# Patient Record
Sex: Female | Born: 1970 | Race: White | Hispanic: No | Marital: Married | Smoking: Never smoker
Health system: Southern US, Community
[De-identification: ages and names within clinical notes are randomized; demographics above are authoritative.]

## PROBLEM LIST (undated history)

## (undated) DIAGNOSIS — N6011 Diffuse cystic mastopathy of right breast: Secondary | ICD-10-CM

## (undated) DIAGNOSIS — E039 Hypothyroidism, unspecified: Secondary | ICD-10-CM

## (undated) DIAGNOSIS — E669 Obesity, unspecified: Secondary | ICD-10-CM

## (undated) DIAGNOSIS — J029 Acute pharyngitis, unspecified: Secondary | ICD-10-CM

## (undated) DIAGNOSIS — K602 Anal fissure, unspecified: Principal | ICD-10-CM

## (undated) DIAGNOSIS — J309 Allergic rhinitis, unspecified: Secondary | ICD-10-CM

## (undated) DIAGNOSIS — I10 Essential (primary) hypertension: Secondary | ICD-10-CM

## (undated) DIAGNOSIS — E282 Polycystic ovarian syndrome: Secondary | ICD-10-CM

## (undated) HISTORY — DX: Obesity, unspecified: E66.9

## (undated) HISTORY — PX: TUBAL LIGATION: SHX77

## (undated) HISTORY — PX: ABDOMINAL HYSTERECTOMY: SHX81

## (undated) HISTORY — DX: Allergic rhinitis, unspecified: J30.9

## (undated) HISTORY — DX: Anal fissure, unspecified: K60.2

## (undated) HISTORY — DX: Polycystic ovarian syndrome: E28.2

## (undated) HISTORY — DX: Acute pharyngitis, unspecified: J02.9

## (undated) HISTORY — DX: Hypothyroidism, unspecified: E03.9

## (undated) HISTORY — DX: Diffuse cystic mastopathy of right breast: N60.11

## (undated) HISTORY — DX: Essential (primary) hypertension: I10

---

## 2002-02-10 ENCOUNTER — Other Ambulatory Visit: Admission: RE | Admit: 2002-02-10 | Discharge: 2002-02-10 | Payer: Self-pay | Admitting: Obstetrics and Gynecology

## 2002-03-26 ENCOUNTER — Encounter: Admission: RE | Admit: 2002-03-26 | Discharge: 2002-06-24 | Payer: Self-pay | Admitting: *Deleted

## 2004-04-05 ENCOUNTER — Other Ambulatory Visit: Admission: RE | Admit: 2004-04-05 | Discharge: 2004-04-05 | Payer: Self-pay | Admitting: Obstetrics and Gynecology

## 2007-05-21 ENCOUNTER — Other Ambulatory Visit: Admission: RE | Admit: 2007-05-21 | Discharge: 2007-05-21 | Payer: Self-pay | Admitting: Obstetrics and Gynecology

## 2007-09-16 ENCOUNTER — Encounter: Payer: Self-pay | Admitting: Family Medicine

## 2007-09-16 ENCOUNTER — Ambulatory Visit: Payer: Self-pay | Admitting: Family Medicine

## 2007-09-16 DIAGNOSIS — E282 Polycystic ovarian syndrome: Secondary | ICD-10-CM | POA: Insufficient documentation

## 2007-09-16 DIAGNOSIS — E039 Hypothyroidism, unspecified: Secondary | ICD-10-CM

## 2007-09-16 HISTORY — DX: Hypothyroidism, unspecified: E03.9

## 2007-09-16 HISTORY — DX: Polycystic ovarian syndrome: E28.2

## 2008-07-22 ENCOUNTER — Ambulatory Visit (HOSPITAL_COMMUNITY): Admission: RE | Admit: 2008-07-22 | Discharge: 2008-07-22 | Payer: Self-pay | Admitting: Obstetrics and Gynecology

## 2008-07-23 ENCOUNTER — Ambulatory Visit: Payer: Self-pay | Admitting: Family Medicine

## 2008-07-23 DIAGNOSIS — I10 Essential (primary) hypertension: Secondary | ICD-10-CM

## 2008-07-23 HISTORY — DX: Essential (primary) hypertension: I10

## 2008-08-03 ENCOUNTER — Ambulatory Visit: Payer: Self-pay | Admitting: Family Medicine

## 2008-08-03 LAB — CONVERTED CEMR LAB
BUN: 11 mg/dL (ref 6–23)
CO2: 24 meq/L (ref 19–32)
Glucose, Bld: 117 mg/dL — ABNORMAL HIGH (ref 70–99)
Potassium: 3.6 meq/L (ref 3.5–5.1)
Sodium: 135 meq/L (ref 135–145)
T3, Free: 3 pg/mL (ref 2.3–4.2)
TSH: 1.62 microintl units/mL (ref 0.35–5.50)

## 2008-09-14 ENCOUNTER — Ambulatory Visit: Payer: Self-pay | Admitting: Family Medicine

## 2008-09-14 DIAGNOSIS — J309 Allergic rhinitis, unspecified: Secondary | ICD-10-CM

## 2008-09-14 HISTORY — DX: Allergic rhinitis, unspecified: J30.9

## 2008-11-05 ENCOUNTER — Ambulatory Visit (HOSPITAL_COMMUNITY): Admission: RE | Admit: 2008-11-05 | Discharge: 2008-11-05 | Payer: Self-pay | Admitting: Obstetrics and Gynecology

## 2008-11-05 ENCOUNTER — Encounter (INDEPENDENT_AMBULATORY_CARE_PROVIDER_SITE_OTHER): Payer: Self-pay | Admitting: Obstetrics and Gynecology

## 2009-01-05 ENCOUNTER — Ambulatory Visit: Payer: Self-pay | Admitting: Family Medicine

## 2009-01-05 DIAGNOSIS — J029 Acute pharyngitis, unspecified: Secondary | ICD-10-CM

## 2009-01-05 HISTORY — DX: Acute pharyngitis, unspecified: J02.9

## 2009-03-25 ENCOUNTER — Ambulatory Visit: Payer: Self-pay | Admitting: Internal Medicine

## 2010-02-06 ENCOUNTER — Ambulatory Visit: Payer: Self-pay | Admitting: Family Medicine

## 2010-02-06 LAB — CONVERTED CEMR LAB
Bilirubin Urine: NEGATIVE
Glucose, Urine, Semiquant: NEGATIVE
Ketones, urine, test strip: NEGATIVE
Specific Gravity, Urine: 1.015
pH: 5.5

## 2010-02-07 LAB — CONVERTED CEMR LAB
Albumin: 3.8 g/dL (ref 3.5–5.2)
BUN: 10 mg/dL (ref 6–23)
Basophils Absolute: 0 10*3/uL (ref 0.0–0.1)
Basophils Relative: 0.5 % (ref 0.0–3.0)
Bilirubin, Direct: 0.1 mg/dL (ref 0.0–0.3)
CO2: 28 meq/L (ref 19–32)
Chloride: 105 meq/L (ref 96–112)
Cholesterol: 221 mg/dL — ABNORMAL HIGH (ref 0–200)
Creatinine, Ser: 0.9 mg/dL (ref 0.4–1.2)
Eosinophils Absolute: 0 10*3/uL (ref 0.0–0.7)
Glucose, Bld: 92 mg/dL (ref 70–99)
MCHC: 34.2 g/dL (ref 30.0–36.0)
MCV: 88.2 fL (ref 78.0–100.0)
Monocytes Absolute: 0.4 10*3/uL (ref 0.1–1.0)
Neutrophils Relative %: 58.4 % (ref 43.0–77.0)
RBC: 4.55 M/uL (ref 3.87–5.11)
RDW: 13.7 % (ref 11.5–14.6)
Total Protein: 7.7 g/dL (ref 6.0–8.3)

## 2010-07-25 NOTE — Assessment & Plan Note (Signed)
Summary: med check/refill/cjr   Vital Signs:  Patient profile:   40 year old female Height:      70.5 inches Weight:      270 pounds BMI:     38.33 Temp:     98.1 degrees F oral BP sitting:   120 / 80  (left arm) Cuff size:   large  Vitals Entered By: Kern Reap CMA Duncan Dull) (February 06, 2010 10:09 AM) CC: follow-up visit   CC:  follow-up visit.  History of Present Illness: [name] is a 40 year old, married female, nonsmoker, who comes in today for evaluation of hypertension, allergic rhinitis, and occasional sleep dysfunction.  Her hypertension is treated with Zestoretic 20 -- 12.5 daily.  BP 120/80.  Her allergic rhinitis is treated with steroid nasal spray and Claritin q.a.m..  She's having a lot of trouble with her allergies to medicine.  Doesn't seem to be helping.  Advised to switch to Zyrtec nightly and to use the Flonase nightly.  She takes 10 mg of Ambien or 4 times a month when she has severe sleep dysfunction.  She continues to see her endocrinologist on a regular basis because she has hypothyroidism and polycystic ovarian syndrome.  The going to start her on metformin.  She also sees her GYN for annual evaluation.  They have her on BCPs because of a history of endometriosis.  Allergies: 1)  ! Codeine 2)  ! Penicillin  Past History:  Past medical, surgical, family and social histories (including risk factors) reviewed, and no changes noted (except as noted below).  Past Medical History: Reviewed history from 07/23/2008 and no changes required.  childbirth x 2 hospitalized for evaluation of bladder dysfunction, unknown.  Diagnosis.  Symptoms resolve spontaneously PCOS Hypothyroidism Hypertension  Family History: Reviewed history from 07/23/2008 and no changes required. Family History Hypertension  Social History: Reviewed history from 09/16/2007 and no changes required. Occupation: Married Never Smoked Alcohol use-no Drug use-no Regular  exercise-no  Review of Systems      See HPI  Physical Exam  General:  Well-developed,well-nourished,in no acute distress; alert,appropriate and cooperative throughout examination Head:  Normocephalic and atraumatic without obvious abnormalities. No apparent alopecia or balding. Eyes:  No corneal or conjunctival inflammation noted. EOMI. Perrla. Funduscopic exam benign, without hemorrhages, exudates or papilledema. Vision grossly normal. Ears:  External ear exam shows no significant lesions or deformities.  Otoscopic examination reveals clear canals, tympanic membranes are intact bilaterally without bulging, retraction, inflammation or discharge. Hearing is grossly normal bilaterally. Nose:  External nasal examination shows no deformity or inflammation. Nasal mucosa are pink and moist without lesions or exudates. Mouth:  Oral mucosa and oropharynx without lesions or exudates.  Teeth in good repair. Neck:  No deformities, masses, or tenderness noted. Chest Wall:  No deformities, masses, or tenderness noted. Lungs:  Normal respiratory effort, chest expands symmetrically. Lungs are clear to auscultation, no crackles or wheezes. Heart:  Normal rate and regular rhythm. S1 and S2 normal without gallop, murmur, click, rub or other extra sounds. Abdomen:  Bowel sounds positive,abdomen soft and non-tender without masses, organomegaly or hernias noted. Msk:  No deformity or scoliosis noted of thoracic or lumbar spine.   Pulses:  R and L carotid,radial,femoral,dorsalis pedis and posterior tibial pulses are full and equal bilaterally Extremities:  No clubbing, cyanosis, edema, or deformity noted with normal full range of motion of all joints.   Neurologic:  No cranial nerve deficits noted. Station and gait are normal. Plantar reflexes are down-going bilaterally. DTRs are  symmetrical throughout. Sensory, motor and coordinative functions appear intact. Skin:  Intact without suspicious lesions or  rashes Cervical Nodes:  No lymphadenopathy noted Inguinal Nodes:  No significant adenopathy Psych:  Cognition and judgment appear intact. Alert and cooperative with normal attention span and concentration. No apparent delusions, illusions, hallucinations   Impression & Recommendations:  Problem # 1:  HYPERTENSION (ICD-401.9) Assessment Improved  Her updated medication list for this problem includes:    Zestoretic 20-12.5 Mg Tabs (Lisinopril-hydrochlorothiazide) .Marland Kitchen... Take 1 tablet by mouth every morning  Orders: Venipuncture (16109) TLB-Lipid Panel (80061-LIPID) TLB-BMP (Basic Metabolic Panel-BMET) (80048-METABOL) TLB-CBC Platelet - w/Differential (85025-CBCD) TLB-Hepatic/Liver Function Pnl (80076-HEPATIC) Prescription Created Electronically [id number]) UA Dipstick w/o Micro (automated)  (81003)  Problem # 2:  HYPOTHYROIDISM (ICD-244.9) Assessment: Improved  Orders: Venipuncture (09811) TLB-Lipid Panel (80061-LIPID) TLB-BMP (Basic Metabolic Panel-BMET) (80048-METABOL) TLB-CBC Platelet - w/Differential (85025-CBCD) TLB-Hepatic/Liver Function Pnl (80076-HEPATIC) Prescription Created Electronically (B1478) UA Dipstick w/o Micro (automated)  (81003)  Problem # 3:  Preventive Health Care (ICD-V70.0) Assessment: Unchanged  Complete Medication List: 1)  Unithyroid  .... Take one tablet daily 2)  Zestoretic 20-12.5 Mg Tabs (Lisinopril-hydrochlorothiazide) .... Take 1 tablet by mouth every morning 3)  Flonase 50 Mcg/act Susp (Fluticasone propionate) .... Uad 4)  Ambien 10 Mg Tabs (Zolpidem tartrate) .... Take one tab by mouth at bedtime 5)  Junel Fe 1/20 1-20 Mg-mcg Tabs (Norethin ace-eth estrad-fe) .... One tab by mouth once daily  Other Orders: Tdap => 48yrs IM (29562) Admin 1st Vaccine (13086) Specimen Handling (57846)  Patient Instructions: 1)  continue current medications except stop the Claritin and use plain Zyrtec, along with the steroid nasal spray nightly for  y   allergic rhinitis. 2)  Please schedule a follow-up appointment in 1 year. 3)  Take an Aspirin every day. Prescriptions: AMBIEN 10 MG TABS (ZOLPIDEM TARTRATE) take one tab by mouth at bedtime  #30 x 1   Entered and Authorized by:   Roderick Pee MD   Signed by:   Roderick Pee MD on 02/06/2010   Method used:   Print then Give to Patient   RxID:   [id number] FLONASE 50 MCG/ACT SUSP (FLUTICASONE PROPIONATE) UAD  #3 units x 3   Entered and Authorized by:   Roderick Pee MD   Signed by:   Roderick Pee MD on 02/06/2010   Method used:   Electronically to        [facility] (retail)       [address]       [place], [place]  [address]       Ph: [phone] or [phone]       Fax: [phone]   RxID:   [id number] ZESTORETIC 20-12.5 MG TABS (LISINOPRIL-HYDROCHLOROTHIAZIDE) Take 1 tablet by mouth every morning  #100 x 3   Entered and Authorized by:   Roderick Pee MD   Signed by:   Roderick Pee MD on 02/06/2010   Method used:   Electronically to        [facility] (retail)       [address]       [place], [place]  [address]       Ph: [phone] or [phone]       Fax: [phone]   RxID:   [id number]    Immunizations Administered:  Tetanus Vaccine:    Vaccine Type: Tdap    Site: left deltoid    Mfr: GlaxoSmithKline    Dose: 0.5 ml    Route: IM    Given by: Kern Reap CMA (AAMA)    Exp. Date: 12/23/2011    Lot #: [id number]    VIS given: 05/13/07 version given February 06, 2010.    Physician counseled: yes   Laboratory Results   Urine Tests  Date/Time Recieved: February 06, 2010 3:08 PM  Date/Time Reported: February 06, 2010 3:08 PM   Routine Urinalysis   Color: yellow Appearance: Clear Glucose: negative   (Normal Range: Negative) Bilirubin: negative   (Normal Range: Negative) Ketone: negative   (Normal Range: Negative) Spec. Gravity: 1.015   (Normal Range:  1.003-1.035) Blood: trace-lysed   (Normal Range: Negative) pH: 5.5   (Normal Range: 5.0-8.0) Protein: negative   (Normal Range: Negative) Urobilinogen: 0.2   (Normal Range: 0-1) Nitrite: negative   (Normal Range: Negative) Leukocyte Esterace: negative   (Normal Range: Negative)    Comments: Wynona Canes, CMA  February 06, 2010 3:08 PM

## 2010-10-03 LAB — BASIC METABOLIC PANEL
CO2: 26 mEq/L (ref 19–32)
Calcium: 9.4 mg/dL (ref 8.4–10.5)
GFR calc Af Amer: >60 mL/min (ref >60)
GFR calc non Af Amer: >60 mL/min (ref >60)
Sodium: 136 mEq/L (ref 135–145)

## 2010-10-03 LAB — CBC
Hemoglobin: 12.5 g/dL (ref 12.0–15.0)
RBC: 4.47 MIL/uL (ref 3.87–5.11)

## 2010-10-09 LAB — CBC
MCHC: 32.6 g/dL (ref 30.0–36.0)
MCV: 80.5 fL (ref 78.0–100.0)
Platelets: 328 10*3/uL (ref 150–400)
RDW: 15.3 % (ref 11.5–15.5)

## 2010-10-09 LAB — COMPREHENSIVE METABOLIC PANEL
AST: 33 U/L (ref 0–37)
Albumin: 4.2 g/dL (ref 3.5–5.2)
CO2: 26 mEq/L (ref 19–32)
Calcium: 9.7 mg/dL (ref 8.4–10.5)
Creatinine, Ser: 0.76 mg/dL (ref 0.4–1.2)
GFR calc Af Amer: >60 mL/min (ref >60)
GFR calc non Af Amer: >60 mL/min (ref >60)
Sodium: 140 mEq/L (ref 135–145)
Total Protein: 9.1 g/dL — ABNORMAL HIGH (ref 6.0–8.3)

## 2010-10-27 ENCOUNTER — Other Ambulatory Visit: Payer: Self-pay | Admitting: Obstetrics and Gynecology

## 2010-10-27 ENCOUNTER — Encounter (HOSPITAL_COMMUNITY): Payer: BC Managed Care – PPO

## 2010-10-27 LAB — BASIC METABOLIC PANEL
CO2: 25 mEq/L (ref 19–32)
Chloride: 101 mEq/L (ref 96–112)
Creatinine, Ser: 0.93 mg/dL (ref 0.4–1.2)
GFR calc Af Amer: >60 mL/min (ref >60)
Glucose, Bld: 77 mg/dL (ref 70–99)

## 2010-10-27 LAB — CBC
HCT: 43.4 % (ref 36.0–46.0)
Hemoglobin: 14.5 g/dL (ref 12.0–15.0)
MCH: 29.1 pg (ref 26.0–34.0)
RBC: 4.99 MIL/uL (ref 3.87–5.11)

## 2010-10-27 LAB — SURGICAL PCR SCREEN: MRSA, PCR: NEGATIVE

## 2010-11-06 ENCOUNTER — Ambulatory Visit (HOSPITAL_COMMUNITY)
Admission: RE | Admit: 2010-11-06 | Discharge: 2010-11-07 | Disposition: A | Discharge status: Home / Self Care | Payer: BC Managed Care – PPO | Source: Ambulatory Visit | Attending: Obstetrics and Gynecology | Admitting: Obstetrics and Gynecology

## 2010-11-06 ENCOUNTER — Other Ambulatory Visit: Payer: Self-pay | Admitting: Obstetrics and Gynecology

## 2010-11-06 DIAGNOSIS — N946 Dysmenorrhea, unspecified: Secondary | ICD-10-CM | POA: Insufficient documentation

## 2010-11-06 DIAGNOSIS — N8300 Follicular cyst of ovary, unspecified side: Secondary | ICD-10-CM | POA: Insufficient documentation

## 2010-11-06 DIAGNOSIS — Z01818 Encounter for other preprocedural examination: Secondary | ICD-10-CM | POA: Insufficient documentation

## 2010-11-06 DIAGNOSIS — Z01812 Encounter for preprocedural laboratory examination: Secondary | ICD-10-CM | POA: Insufficient documentation

## 2010-11-07 LAB — CBC
HCT: 37.2 % (ref 36.0–46.0)
Hemoglobin: 12.2 g/dL (ref 12.0–15.0)
WBC: 8.5 10*3/uL (ref 4.0–10.5)

## 2010-11-07 NOTE — Op Note (Signed)
NAMEKORYNNE, [name]               ACCOUNT NO.:  [id number]   MEDICAL RECORD NO.:  [id number]          PATIENT TYPE:  AMB   LOCATION:  SDC                           [facility]ACILITY:  [facility]   PHYSICIAN:  Guy Sandifer. Henderson Cloud, M.D. DATE OF BIRTH:  02-04-1971   DATE OF PROCEDURE:  11/05/2008  DATE OF DISCHARGE:                               OPERATIVE REPORT   PREOPERATIVE DIAGNOSES:  1. Menorrhagia.  2. Desires permanent sterilization.   POSTOPERATIVE DIAGNOSES:  1. Menorrhagia.  2. Desires permanent sterilization.   PROCEDURE:  NovaSure endometrial ablation, hysteroscopy, dilatation and  curettage, laparoscopy with bilateral tubal ligation with Filshie clips.   SURGEON:  Guy Sandifer. Henderson Cloud, MD   ANESTHESIA:  General with endotracheal intubation.   SPECIMENS:  Endometrial curettings to Pathology.   ESTIMATED BLOOD LOSS:  Minimal.  I and Os of distending media 60 mL  deficit.   INDICATIONS AND CONSENT:  This patient is a 40 year old married white  female G3, P2 with irregular crampy heavy menses.  After discussion of  options, she is being admitted for laparoscopy with tubal ligation,  hysteroscopy, D&C, and NovaSure endometrial ablation.  Potential risks  and complications have been discussed preoperatively including but  limited to infection, uterine perforation, organ damage, bleeding  requiring transfusion of blood products with HIV and hepatitis  acquisition, DVT, PE, pneumonia, laparotomy, and hysterectomy.  Success  and failure rates of tubal ligation and endometrial ablation have been  reviewed.  Possible postoperative dyspareunia reviewed.  All questions  were answered and consent was signed on the chart.   FINDINGS:  Upper abdomen is grossly normal.  Uterus is upper normal in  size, smooth in contour.  Anterior posterior cul-de-sacs were normal.  Tubes and ovaries normal bilaterally.  Endometrial cavity, both  fallopian tube ostia identified.   PROCEDURE IN DETAIL:  The patient  was taken to the operating room where  she was identified, placed in dorsal supine position and general  anesthesia was induced via endotracheal intubation.  She was then placed  in dorsal lithotomy position where she was prepped abdominally and  vaginally, bladder straight catheterized, and draped in sterile fashion.  Time-out was undertaken.  A bivalve speculum was placed in vagina.  Anterior cervical lip was injected with 1.5% plain Marcaine and grasped  with a single-tooth tenaculum.  Paracervical block was placed at 2, 4,  5, 7, 8, and 10 o'clock positions with approximately 10 mL total of the  same solution.  Uterus sounds to 10 cm.  Cervix sounds to 5 cm.  Cervix  was gently progressively dilated.  Diagnostic hysteroscope was placed in  the endocervical canal and advanced under direct visualization using  distending media.  The above findings were noted.  Hysteroscope was  withdrawn and sharp curettage was carried out.  Hysteroscope was then  replaced and inspection reveals the cavity to be clean.  The NovaSure  endometrial device was then placed.  Cavity width was 3.8 cm.  Cavity  test was passed on the first attempt.  Ablation was then carried out  without difficulty for approximately 1 minute 20 seconds.  The  endometrial ablation device was removed and inspection revealed to be  intact.  The hysteroscope was then replaced in the endocervical canal,  again advanced under direct visualization with distending media.  Good  cautery effect was noted.  No evidence of perforation was noted.  The  single-tooth tenaculum was then replaced with a Hulka tenaculum and  attention was turned to the abdomen.  The infraumbilical and suprapubic  areas were both injected with approximately 5 mL total of the same  solution.  Small infraumbilical incision was made.  Then using a long  disposable Veress needle and towel clips on either side of the umbilicus  to elevate the anterior abdominal wall,  it was placed.  A good syringe  and drop test were noted.  A 2.5 liters of gas were then instilled under  low pressure with good tympany in the right upper quadrant.  Veress  needle was then removed and 10/11 Xcel bladeless disposable trocar  sleeve was placed using direct visualization with the diagnostic  laparoscope.  After placing, the operative laparoscope was used.  A  small suprapubic incision was made and a 5-mm Xcel bladeless disposable  trocar sleeve was placed under direct visualization without difficulty.  The above findings were noted.  After identifying fallopian tubes from  cornu to fimbria.  A Filshie clip was placed on the proximal 1/3 of the  tubes bilaterally.  After removal of the clip applicator, inspection  reveals the full width of the tube to be within the clip and the heel of  the clip to be visible through the mesosalpinx bilaterally.  Good  hemostasis was noted.  A 10 mL of the same Marcaine solution was  instilled into peritoneal cavity for a total of approximately 25 mL for  the entire case.  Suprapubic trocar sleeve was removed.  Pneumoperitoneum was reduced.  The umbilical trocar sleeve was removed.  Skin was closed with a 2-0 subcuticular Vicryl sutures and Dermabond was  placed on both incisions as well.  Hulka tenaculum was removed and good  hemostasis was noted.  All counts were correct.  The patient was  awakened, taken to recovery room in stable condition.       Guy Sandifer Henderson Cloud, M.D.  Electronically Signed     JET/MEDQ  D:  11/05/2008  T:  11/05/2008  Job:  [id number]

## 2010-11-07 NOTE — H&P (Signed)
[name], [name]               ACCOUNT NO.:  [id number]   MEDICAL RECORD NO.:  [id number]          PATIENT TYPE:  AMB   LOCATION:  SDC                           [facility]ILITY:  [facility]   PHYSICIAN:  Guy Sandifer. Henderson Cloud, M.D. DATE OF BIRTH:  03-23-1971   DATE OF ADMISSION:  DATE OF DISCHARGE:                              HISTORY & PHYSICAL   CHIEF COMPLAINT:  Heavy irregular menses and desires permanent  sterilization.   HISTORY OF PRESENT ILLNESS:  This patient is a 40 year old married white  female G3, P2, who has a history of irregular crampy menses.  She has  recently had blood pressure elevations on the birth control pill.  After  discussion of the options, she is being admitted for laparoscopy with  Filshie clip application hysteroscopy, dilatation and curettage, and  NovaSure endometrial ablation.  Ultrasound in my office on April 13, 2008, revealed the uterus measuring 11.3 x 6.0 x 7.6 cm.  The right  ovary has a 15-mm collapse cyst.  Left ovary appears normal with a 5-mm  follicular cyst.  All questions have been answered preoperatively and  potential risks and complications have been discussed.   PAST MEDICAL HISTORY:  Hypothyroidism.   PAST SURGICAL HISTORY:  Negative.   OBSTETRICAL HISTORY:  Cesarean section x1, vaginal delivery x1.   FAMILY HISTORY:  Positive for diabetes.   SOCIAL HISTORY:  Denies tobacco, alcohol, or drug abuse.   MEDICATIONS:  Femcon and Unithroid 100 mcg supplement daily.   ALLERGIES:  CODEINE leading to nausea and vomiting (the patient states  Percocet is okay and penicillin leading to a questionable rash).   REVIEW OF SYSTEMS:  NEUROLOGIC:  Denies headache.  CARDIAC:  Denies  chest pain.  PULMONARY:  Denies shortness of breath.   PHYSICAL EXAMINATION:  VITAL SIGNS:  Height 5 feet 20-1/2 inch, weight  266.8 pounds, blood pressure 152/100.  HEENT:  Without thyromegaly.  LUNGS:  Clear to auscultation.  HEART:  Regular rate and rhythm.  ABDOMEN:  Obese, soft, nontender without palpable masses.  PELVIC:  Vagina and cervix without lesion.  Uterus normal size, mobile,  nontender.  Adnexal exam nontender without masses.  EXTREMITIES:  Grossly within normal limits.  NEUROLOGIC:  Grossly within normal limits.   ASSESSMENT:  Irregular heavy menses with dysmenorrhea.   PLAN:  Laparoscopy with Filshie clip application hysteroscopy,  dilatation and curettage, NovaSure endometrial ablation.      Guy Sandifer Henderson Cloud, M.D.  Electronically Signed     JET/MEDQ  D:  07/13/2008  T:  07/14/2008  Job:  [id number]

## 2010-11-07 NOTE — H&P (Signed)
  [name], [name]               ACCOUNT NO.:  [id number]  MEDICAL RECORD NO.:  [id number]         PATIENT TYPE:  WAMB  LOCATION:                                [facility]ILITY:  [facility]  PHYSICIAN:  Guy Sandifer. Henderson Cloud, M.D. DATE OF BIRTH:  July 06, 1970  DATE OF ADMISSION:  11/06/2010 DATE OF DISCHARGE:                             HISTORY & PHYSICAL   CHIEF COMPLAINT:  Painful menses.  HISTORY OF PRESENT ILLNESS:  This patient is a 40 year old married white female, G3, P2, status post laparoscopy with tubal ligation and NovaSure endometrial ablation in May 2010.  She has had recurrent dysmenorrhea especially on the right side.  Attempts to control with the birth control pill have been unsatisfactory.  Ultrasound on Nov 01, 2010 revealed uterus measuring 11.0 x 6.7 x 8.2 cm with at least two intramural leiomyomata measuring 2.1 and 3.0 cm respectively.  The right ovary appears normal.  Unable to visualize the left ovary due to overlying bowel.  After discussion of options, she is being admitted for laparoscopically-assisted vaginal hysterectomy and removal of the tube and ovary if abnormal.  Potential risks and complications have been reviewed preoperatively.  PAST MEDICAL HISTORY: 1. Hypothyroidism. 2. Chronic hypertension.  PAST SURGICAL HISTORY:  Laparoscopy with tubal ligation and endometrial ablation as above.  OBSTETRIC HISTORY:  Cesarean section in 1999, vaginal birth in 2004.  MEDICATIONS: 1. Unithroid 0.1 mg daily. 2. Junel birth control pill daily. 3. Lisinopril daily.  ALLERGIES:  CODEINE leading to nausea and vomiting.  PENICILLIN leading to hives.  SOCIAL HISTORY:  The patient drinks on occasion.  Denies tobacco and drug abuse.  FAMILY HISTORY:  Positive for migraine headaches and diabetes.  REVIEW OF SYSTEMS:  NEUROLOGIC:  Denies headache.  CARDIAC:  Denies chest pain.  PULMONARY:  Denies shortness of breath.  PHYSICAL EXAMINATION:  VITAL SIGNS:  Height 5 feet and  10.5 inches, weight 261.6 pounds, blood pressure 128/78. LUNGS:  Clear to auscultation. HEART:  Regular rate and rhythm. ABDOMEN:  Obese, soft, nontender without masses. PELVIC:  Vulva, vagina and cervix without lesion.  Exam is compromised by patient habitus, although it is mildly tender throughout.  ASSESSMENT:  Dysmenorrhea.  PLAN:  Laparoscopically-assisted vaginal hysterectomy, removal of tube and ovary if abnormal.     Guy Sandifer. Henderson Cloud, M.D.     JET/MEDQ  D:  11/01/2010  T:  11/02/2010  Job:  [id number]  Electronically Signed by Harold Hedge M.D. on 11/07/2010 01:51:22 PM

## 2010-11-07 NOTE — Discharge Summary (Signed)
  [name], [name]               ACCOUNT NO.:  [id number]  MEDICAL RECORD NO.:  [id number]           PATIENT TYPE:  O  LOCATION:  9320                          [facility]ILITY:  [facility]  PHYSICIAN:  Guy Sandifer. Henderson Cloud, M.D. DATE OF BIRTH:  January 24, 1971  DATE OF ADMISSION:  11/06/2010 DATE OF DISCHARGE:  11/07/2010                              DISCHARGE SUMMARY   ADMITTING DIAGNOSIS:  Dysmenorrhea.  DISCHARGE DIAGNOSIS:  Dysmenorrhea and pelvic adhesions.  PROCEDURE:  On Nov 06, 2010, is laparoscopically-assisted vaginal hysterectomy and lysis of adhesions.  REASON FOR ADMISSION:  This patient is a 40 year old married white female G2, P2 status post tubal ligation and endometrial ablation with increasingly painful menses.  This is dictated in history and physical. She is admitted for surgical management.  HOSPITAL COURSE:  The patient was admitted to the hospital, undergoes the above procedure.  Estimated blood loss is 200 mL.  On the evening of surgery, she has good pain relief and is tolerating some food.  Vital signs are stable.  She is afebrile with dilute urine output.  On the day of discharge, she is tolerating regular diet, has excellent pain relief, is ambulating, passing flatus.  Vital signs are stable.  She is afebrile and hemoglobin is 12.2.  Pathology is pending.  She will be discharged after tolerating breakfast and after voiding.  CONDITION ON DISCHARGE:  Good.  DIET:  Regular as tolerated.  ACTIVITY:  No lifting, no operation of automobiles, no vaginal entry.  Followup is in the office in 2 weeks.  She is to call the office for problems including but not limited to temperature 101 degrees, persistent nausea, vomiting, increasing pain or heavy vaginal bleeding.  MEDICATIONS: 1. Percocet 5/325 mg, #30 one to two p.o. q.6 h p.r.n. 2. Ibuprofen 600 mg q.6 h p.r.n. and she will resume her Unithroid and     lisinopril.     Guy Sandifer Henderson Cloud, M.D.    JET/MEDQ  D:   11/07/2010  T:  11/07/2010  Job:  [id number]  Electronically Signed by Harold Hedge M.D. on 11/07/2010 01:51:31 PM

## 2010-11-07 NOTE — H&P (Signed)
[name], [name]               ACCOUNT NO.:  [id number]   MEDICAL RECORD NO.:  [id number]          PATIENT TYPE:  AMB   LOCATION:  SDC                           [facility]   PHYSICIAN:  Guy Sandifer. Henderson Cloud, M.D. DATE OF BIRTH:  11/17/1970   DATE OF ADMISSION:  DATE OF DISCHARGE:                              HISTORY & PHYSICAL   CHIEF COMPLAINT:  Heavy irregular menses and desires permanent  sterilization.   HISTORY OF PRESENT ILLNESS:  This patient is a 40 year old married white  female G3, P2, who has irregular crampy heavy menses.  After discussion  of options, she is being admitted for laparoscopy with tubal ligation,  hysteroscopy D and C, and Novasure endometrial ablation.  Potential  risks, complications, success, and failure rates have been reviewed.  Ultrasound in my office on Nov 02, 2008, revealed a uterus measuring  10.6 x 6.2 x 7.1 cm.  There is a 1.3-cm simple cyst on the right ovary.  The patient was scheduled for this surgery previously.  However, she was  hypertensive on presentation in the hospital and has been rescheduled  until today.   PAST MEDICAL HISTORY:  Thyroid disease and hypertension.   PAST SURGICAL HISTORY:  Negative.   OBSTETRICAL HISTORY:  Cesarean section in 1999 and vaginal birth in  2004.   MEDICATIONS:  1. Unithroid 100 mcg daily.  2. Lisinopril daily.  3. Zyrtec daily.   ALLERGIES:  CODEINE leading to nausea and vomiting and PENICILLIN  leading to hives.   SOCIAL HISTORY:  The patient has 4 drinks a month.  Denies tobacco and  drug abuse.   FAMILY HISTORY:  Positive for migraine headache and diabetes.   REVIEW OF SYSTEMS:  NEURO:  Denies headache.  CARDIAC:  No chest pain.  PULMONARY:  Denies shortness of breath.   PHYSICAL EXAMINATION:  VITAL SIGNS:  Height 5 feet 10-1/2 inches, weight  266.8 pounds, and blood pressure 114/80.  LUNGS:  Clear to auscultation.  HEART:  Regular rate and rhythm.  ABDOMEN:  Soft, nontender  without masses.  PELVIC:  Vulva, vagina, and cervix without lesion.  Uterus, upper normal  size, mobile, and nontender.  Adnexa nontender without palpable masses.  EXTREMITIES:  Grossly within normal limits.  NEUROLOGIC:  Grossly within normal limits.   ASSESSMENT:  Heavy irregular menses.   PLAN:  Laparoscopy with tubal ligation, hysteroscopy D and C, and  Novasure endometrial ablation.      Guy Sandifer Henderson Cloud, M.D.  Electronically Signed     JET/MEDQ  D:  11/02/2008  T:  11/02/2008  Job:  [id number]

## 2010-11-07 NOTE — Op Note (Signed)
[name], [name]               ACCOUNT NO.:  [id number]  MEDICAL RECORD NO.:  [id number]           PATIENT TYPE:  LOCATION:                                 FACILITY:  PHYSICIAN:  Guy Sandifer. Henderson Cloud, M.D. DATE OF BIRTH:  [birth year]  DATE OF PROCEDURE:  11/06/2010 DATE OF DISCHARGE:                              OPERATIVE REPORT   PREOPERATIVE DIAGNOSIS:  Dysmenorrhea.  POSTOPERATIVE DIAGNOSES:  Dysmenorrhea and pelvic adhesions.  PROCEDURE:  Laparoscopy with lysis of adhesions.  SURGEON:  Guy Sandifer. Henderson Cloud, MD  ASSISTANT:  Dineen Kid. Rana Snare, MD  ANESTHESIA:  General endotracheal intubation.  SPECIMENS:  Uterus to pathology, weight 232 grams.  ESTIMATED BLOOD LOSS:  200 mL.  INDICATIONS AND CONSENT:  This patient is a 40 year old married white female G3, P2 status post tubal ligation and NovaSure endometrial ablation in May 2010.  She has had recurrent dysmenorrhea especially on the right side.  Details are dictated in the history and physical. Laparoscopically-assisted vaginal hysterectomy and removal of the tube and ovary if abnormal have been discussed.  Potential risks and complications have been reviewed preoperatively including but limited to infection, organ damage, bleeding requiring transfusion of blood products with HIV and hepatitis acquisition, DVT, PE, pneumonia, fistula formation, postoperative pain, and/or dyspareunia.  All questions have been answered and consent is signed on the chart.  FINDINGS:  Upper abdomen is grossly normal.  Uterus is about 10-12 weeks in size.  Left ovary has a smooth translucent 1 cm follicular cyst. Right ovary is normal.  Fallopian tubes are status post tubal ligation bilaterally with Filshie clips.  There are multiple filmy adhesions from the proximal portion of the fallopian tube and a Filshie clip to the right round ligament.  Anterior and posterior cul-de-sacs were clean.  PROCEDURE:  The patient was taken to operating room  where she was identified, placed in dorsal supine position and general anesthesia was induced via endotracheal intubation.  She was then placed in dorsal lithotomy position.  Time-out was undertaken.  She was prepped abdominally and vaginally.  Bladder straight catheterized.  Hulka tenaculum was placed.  The uterus is a manipulator and she was draped in sterile fashion.  The infraumbilical and suprapubic areas were injected midline with 0.5% plain Marcaine, approximately 6 mL total.  Small infraumbilical incision was made.  Disposable Veress was placed using towel clips on either side of the umbilicus to elevate the anterior abdominal wall.  This did not give a satisfactory syringe test. Therefore, the Veress needle was removed and open laparoscopy was pursued.  This infraumbilical midline incision was extended about 1 cm. Dissection was carried out in layers to the peritoneum which was bluntly entered.  Disposable Hasson trocar sleeve was then placed and anchored in place with 0 Vicryl sutures which are sewn to the anterior leaf of the fascia on the way in.  This allowed good visualization.  A small suprapubic incision was made and a 5-mm bladeless disposable trocar sleeve was placed under direct visualization.  The above findings were noted.  Using the enseal bipolar cautery cutting cautery cutting instrument, the adhesions on the right side  were taken down and the ligaments were taken down proximally down to the level of vesicouterine peritoneum.  This was also done on the left side.  The proximal portion of fallopian tube was incorporated in this bilaterally.  The vesicouterine peritoneum was taken down cephalad laterally.  Instruments were removed and attention was turned to the vagina.  Posterior cul-de- sac was entered sharply.  Cervix was circumscribed with unipolar cautery.  Mucosa was advanced sharply and bluntly.  Using the super jaws bipolar cautery cutting instrument,  progressive pedicles were taken down of the uterosacral ligaments, bladder pillars, cardinal ligaments, and uterine vessels bilaterally.  Anterior cul-de-sac was entered without difficulty.  Fundus was delivered posteriorly and the remaining pedicles were taken down.  All suture will be 0 Monocryl unless otherwise designated.  The uterosacral ligaments were plicated vaginal cuff bilaterally and I then plicated midline with a third suture.  Cuff was closed with figure-of-eights.  Foley catheter was placed in the bladder and clear urine was noted.  Attention was returned to the abdomen. Irrigation was carried out.  Only minor oozing at peritoneal edges was controlled with bipolar cautery.  Excess fluid was removed.  The remaining 29 mL of 0.5% plain Marcaine was instilled into peritoneal cavity.  Instruments were removed.  The umbilical incision was closed using the anchoring sutures of 0 Vicryl in the anterior fascia under good visualization to completely close the fascia.  Skin was closed with interrupted 2-0 Vicryl sutures on the umbilicus and suprapubically. Dermabond was applied.  All counts were correct.  The patient was awakened, taken to recovery room in stable condition.     Guy Sandifer Henderson Cloud, M.D.     JET/MEDQ  D:  11/06/2010  T:  11/06/2010  Job:  [id number]  Electronically Signed by Harold Hedge M.D. on 11/07/2010 01:51:26 PM

## 2010-11-13 ENCOUNTER — Emergency Department (HOSPITAL_BASED_OUTPATIENT_CLINIC_OR_DEPARTMENT_OTHER)
Admission: EM | Admit: 2010-11-13 | Discharge: 2010-11-13 | Disposition: A | Discharge status: Home / Self Care | Payer: BC Managed Care – PPO | Attending: Emergency Medicine | Admitting: Emergency Medicine

## 2010-11-13 DIAGNOSIS — E039 Hypothyroidism, unspecified: Secondary | ICD-10-CM | POA: Insufficient documentation

## 2010-11-13 DIAGNOSIS — I10 Essential (primary) hypertension: Secondary | ICD-10-CM | POA: Insufficient documentation

## 2010-11-13 DIAGNOSIS — R109 Unspecified abdominal pain: Secondary | ICD-10-CM | POA: Insufficient documentation

## 2010-11-13 DIAGNOSIS — N390 Urinary tract infection, site not specified: Secondary | ICD-10-CM | POA: Insufficient documentation

## 2010-11-13 LAB — URINALYSIS, ROUTINE W REFLEX MICROSCOPIC
Glucose, UA: NEGATIVE mg/dL
Specific Gravity, Urine: 1.011 (ref 1.005–1.030)

## 2010-11-13 LAB — URINE MICROSCOPIC-ADD ON

## 2011-01-30 ENCOUNTER — Telehealth: Payer: Self-pay | Admitting: Family Medicine

## 2011-01-30 NOTE — Telephone Encounter (Signed)
Pt called and is req info re: pts immunizations. Pls call.

## 2011-01-30 NOTE — Telephone Encounter (Signed)
Left message on machine for patient  Safe for patient to get a Td.

## 2011-02-08 ENCOUNTER — Ambulatory Visit: Payer: BC Managed Care – PPO | Admitting: Family Medicine

## 2011-02-09 ENCOUNTER — Encounter: Payer: Self-pay | Admitting: Family Medicine

## 2011-02-12 ENCOUNTER — Ambulatory Visit (INDEPENDENT_AMBULATORY_CARE_PROVIDER_SITE_OTHER): Payer: BC Managed Care – PPO | Admitting: Family Medicine

## 2011-02-12 ENCOUNTER — Other Ambulatory Visit (INDEPENDENT_AMBULATORY_CARE_PROVIDER_SITE_OTHER): Payer: BC Managed Care – PPO

## 2011-02-12 DIAGNOSIS — Z23 Encounter for immunization: Secondary | ICD-10-CM

## 2011-02-12 DIAGNOSIS — Z0000 Encounter for general adult medical examination without abnormal findings: Secondary | ICD-10-CM

## 2011-02-12 DIAGNOSIS — Z2089 Contact with and (suspected) exposure to other communicable diseases: Secondary | ICD-10-CM

## 2011-02-12 NOTE — Progress Notes (Signed)
Addended by: Kern Reap B on: 02/12/2011 11:42 AM   Modules accepted: Orders

## 2011-02-13 LAB — MEASLES/MUMPS/RUBELLA IMMUNITY: Rubella: 74 IU/mL — ABNORMAL HIGH

## 2011-02-20 ENCOUNTER — Other Ambulatory Visit: Payer: Self-pay | Admitting: *Deleted

## 2011-02-20 MED ORDER — LISINOPRIL-HYDROCHLOROTHIAZIDE 20-12.5 MG PO TABS: 1.0000 | ORAL_TABLET | Freq: Every day | ORAL | Status: DC

## 2011-02-27 ENCOUNTER — Telehealth: Payer: Self-pay | Admitting: Family Medicine

## 2011-02-27 NOTE — Telephone Encounter (Signed)
Pt req results from mmr titer. Pls call.

## 2011-02-28 NOTE — Telephone Encounter (Signed)
Spoke with patient and copy faxed

## 2011-03-12 ENCOUNTER — Ambulatory Visit: Payer: BC Managed Care – PPO | Admitting: Family Medicine

## 2011-03-19 ENCOUNTER — Ambulatory Visit: Payer: BC Managed Care – PPO | Admitting: Family Medicine

## 2011-03-23 ENCOUNTER — Ambulatory Visit (INDEPENDENT_AMBULATORY_CARE_PROVIDER_SITE_OTHER): Payer: BC Managed Care – PPO | Admitting: *Deleted

## 2011-03-23 DIAGNOSIS — Z23 Encounter for immunization: Secondary | ICD-10-CM

## 2011-03-28 ENCOUNTER — Encounter: Payer: Self-pay | Admitting: Family Medicine

## 2011-03-28 ENCOUNTER — Ambulatory Visit (INDEPENDENT_AMBULATORY_CARE_PROVIDER_SITE_OTHER): Payer: BC Managed Care – PPO | Admitting: Family Medicine

## 2011-03-28 DIAGNOSIS — J309 Allergic rhinitis, unspecified: Secondary | ICD-10-CM

## 2011-03-28 DIAGNOSIS — E282 Polycystic ovarian syndrome: Secondary | ICD-10-CM

## 2011-03-28 DIAGNOSIS — I10 Essential (primary) hypertension: Secondary | ICD-10-CM

## 2011-03-28 MED ORDER — LISINOPRIL-HYDROCHLOROTHIAZIDE 20-12.5 MG PO TABS: 1.0000 | ORAL_TABLET | Freq: Every day | ORAL | Status: DC

## 2011-03-28 NOTE — Patient Instructions (Signed)
Continue your blood pressure medication, one tab daily.  Remembered where a sunscreens SPF 50.  Return in one year for general checkup sooner if any problems

## 2011-03-28 NOTE — Progress Notes (Signed)
  Subjective:    Patient ID: [name], female    DOB: 25-Jun-1971, 40 y.o.   MRN: [id number]  Hypertension  [name] Is a 40 year old female, nonsmoker, who comes in today for annual examination.  Because of a history of PCO S., hyper-tension, sun damage.  Her blood pressures, treated with Zestoretic 20 -- 2.5 daily.  BP 110/70.  She is to stay right nasal spray for allergic rhinitis.  She is on metformin 500 mg in the morning and 1000 mg prior to evening meal from her endocrinologist because of PCO S.  She had her uterus removed in May for nonmalignant reasons.  Ovaries were left intact.  She was born and raised in [place] has light skin and light eyes and has had lesions removed.  Unfortunate, none of which were cancer.  However, she is in a high risk category.    Review of Systems  Constitutional: Negative.   HENT: Negative.   Eyes: Negative.   Respiratory: Negative.   Cardiovascular: Negative.   Gastrointestinal: Negative.   Genitourinary: Negative.   Musculoskeletal: Negative.   Neurological: Negative.   Hematological: Negative.   Psychiatric/Behavioral: Negative.        Objective:   Physical Exam  Constitutional: She appears well-developed and well-nourished.  HENT:  Head: Normocephalic and atraumatic.  Right Ear: External ear normal.  Left Ear: External ear normal.  Nose: Nose normal.  Mouth/Throat: Oropharynx is clear and moist.  Eyes: EOM are normal. Pupils are equal, round, and reactive to light.  Neck: Normal range of motion. Neck supple. No thyromegaly present.  Cardiovascular: Normal rate, regular rhythm, normal heart sounds and intact distal pulses.  Exam reveals no gallop and no friction rub.   No murmur heard. Pulmonary/Chest: Effort normal and breath sounds normal.  Abdominal: Soft. Bowel sounds are normal. She exhibits no distension and no mass. There is no tenderness. There is no rebound.  Musculoskeletal: Normal range of motion.    Lymphadenopathy:    She has no cervical adenopathy.  Neurological: She is alert. She has normal reflexes. No cranial nerve deficit. She exhibits normal muscle tone. Coordination normal.  Skin: Skin is warm and dry.  Psychiatric: She has a normal mood and affect. Her behavior is normal. Judgment and thought content normal.          Assessment & Plan:  Healthy female.  Hypertension.  Continue Zestoretic one daily.  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  Allergic rhinitis.  Continue steroid nasal spray.  History of sun damage.  Check.  Skin mildly return p.r.n. Sunscreens SPF 50.  Status post hysterectomy for fibroids.  May 2012  Return in one year sooner for any problems

## 2011-04-23 ENCOUNTER — Ambulatory Visit (INDEPENDENT_AMBULATORY_CARE_PROVIDER_SITE_OTHER): Payer: BC Managed Care – PPO | Admitting: Family Medicine

## 2011-04-23 DIAGNOSIS — Z23 Encounter for immunization: Secondary | ICD-10-CM

## 2011-06-04 ENCOUNTER — Encounter: Payer: Self-pay | Admitting: Family Medicine

## 2011-06-04 ENCOUNTER — Ambulatory Visit (INDEPENDENT_AMBULATORY_CARE_PROVIDER_SITE_OTHER): Payer: BC Managed Care – PPO | Admitting: Family Medicine

## 2011-06-04 ENCOUNTER — Telehealth: Payer: Self-pay | Admitting: Family Medicine

## 2011-06-04 VITALS — BP 118/80 | Temp 98.8°F | Wt 273.0 lb

## 2011-06-04 DIAGNOSIS — J069 Acute upper respiratory infection, unspecified: Secondary | ICD-10-CM

## 2011-06-04 MED ORDER — HYDROCODONE-HOMATROPINE 5-1.5 MG/5ML PO SYRP: ORAL_SOLUTION | ORAL | Status: DC

## 2011-06-04 NOTE — Patient Instructions (Signed)
Drink lots of water.  Hydromet one half to 1 teaspoon up to 3 times daily p.r.n. Cough.  Return p.r.n.

## 2011-06-04 NOTE — Telephone Encounter (Signed)
Appt made

## 2011-06-04 NOTE — Telephone Encounter (Signed)
Okay to work in

## 2011-06-04 NOTE — Progress Notes (Signed)
  Subjective:    Patient ID: [name], female    DOB: 1971/05/20, 40 y.o.   MRN: [id number]  HPI  T. Is a 40 year old female, nonsmoker, who comes in today with a cold for two weeks.  She has had congestion, sore throat, postnasal drip, and cough.  No fever, no sputum production.  No wheezing    Review of Systems General pulmonary venous systems otherwise negative    Objective:   Physical Exam Well-developed well-nourished, female in no acute distress.  Examination of the head, eyes, ears, nose, and throat were negative.  Neck is supple.  Thyroid is not enlarged.  Lungs are clear to auscultation       Assessment & Plan:  Viral syndrome.  Plan treat symptomatically

## 2011-06-04 NOTE — Telephone Encounter (Signed)
Wants to see Dr Tawanna Cooler today for chest congestion. Please advise patient. Thanks.

## 2011-08-15 ENCOUNTER — Ambulatory Visit: Payer: BC Managed Care – PPO | Admitting: Family Medicine

## 2011-08-20 ENCOUNTER — Ambulatory Visit (INDEPENDENT_AMBULATORY_CARE_PROVIDER_SITE_OTHER): Payer: BC Managed Care – PPO | Admitting: Family Medicine

## 2011-08-20 DIAGNOSIS — Z23 Encounter for immunization: Secondary | ICD-10-CM

## 2012-05-11 ENCOUNTER — Other Ambulatory Visit: Payer: Self-pay | Admitting: Family Medicine

## 2012-08-19 ENCOUNTER — Other Ambulatory Visit: Payer: Self-pay | Admitting: Family Medicine

## 2012-11-25 ENCOUNTER — Ambulatory Visit (INDEPENDENT_AMBULATORY_CARE_PROVIDER_SITE_OTHER): Payer: BC Managed Care – PPO | Admitting: Family Medicine

## 2012-11-25 ENCOUNTER — Encounter: Payer: Self-pay | Admitting: Family Medicine

## 2012-11-25 VITALS — BP 120/80 | Temp 98.3°F | Wt 259.0 lb

## 2012-11-25 DIAGNOSIS — E039 Hypothyroidism, unspecified: Secondary | ICD-10-CM

## 2012-11-25 DIAGNOSIS — I10 Essential (primary) hypertension: Secondary | ICD-10-CM

## 2012-11-25 DIAGNOSIS — E282 Polycystic ovarian syndrome: Secondary | ICD-10-CM

## 2012-11-25 DIAGNOSIS — N6019 Diffuse cystic mastopathy of unspecified breast: Secondary | ICD-10-CM

## 2012-11-25 DIAGNOSIS — N6011 Diffuse cystic mastopathy of right breast: Secondary | ICD-10-CM

## 2012-11-25 MED ORDER — LISINOPRIL-HYDROCHLOROTHIAZIDE 20-12.5 MG PO TABS: ORAL_TABLET | ORAL | Status: DC

## 2012-11-25 NOTE — Progress Notes (Signed)
  Subjective:    Patient ID: [name], female    DOB: April 05, 1971, 42 y.o.   MRN: [id number]  HPI [name] is a 42 year old married female nonsmoker who comes in today for general physical examination because of a history of hypertension, polycystic ovarian syndrome, and a new problem of fibrocystic breast changes. She also has hypothyroidism  For the PCL SN hypothyroidism she sees an endocrinologist  For hypertension she takes Zestoretic 20-4.5 daily BP 120/80  She had a GYN exam this year and found a breast lump in her left breast. She had a diagnostic ultrasound and mammography both of which were normal.  Family history pertinent in her mom has the same fibrocystic changes.   Review of Systems    review of systems otherwise negative,,,, weight 259 Objective:   Physical Exam  Well-developed extremely pleasant female no acute distress BP 120/80 right arm sitting position cardiopulmonary exam normal breast exam shows a marble-sized soft movable cystic lesion in the midshaft from the nipple on the right at 3:00. She also has multiple small fibrocystic lesions throughout both breasts.  Total body skin exam normal      Assessment & Plan:  Hypertension at goal continue current therapy  Fibrocystic disease,,,,,,,, demonstrated comprehensive BSE monthly and annual mammography return if any question

## 2012-11-25 NOTE — Patient Instructions (Signed)
Continue your blood pressure medication daily  Remember to do a thorough girl BSE monthly  If you have any questions about a Breast cyst  getting bigger call and we will evaluate you immediately  Return in one year for general physical exam sooner if any problems  SPF 50+ screen  Exercise 30 minutes daily walking

## 2013-04-17 ENCOUNTER — Ambulatory Visit (INDEPENDENT_AMBULATORY_CARE_PROVIDER_SITE_OTHER): Payer: BC Managed Care – PPO | Admitting: *Deleted

## 2013-04-17 DIAGNOSIS — Z23 Encounter for immunization: Secondary | ICD-10-CM

## 2013-04-17 DIAGNOSIS — Z0000 Encounter for general adult medical examination without abnormal findings: Secondary | ICD-10-CM

## 2013-04-21 ENCOUNTER — Ambulatory Visit (INDEPENDENT_AMBULATORY_CARE_PROVIDER_SITE_OTHER): Payer: BC Managed Care – PPO | Admitting: *Deleted

## 2013-04-21 DIAGNOSIS — Z299 Encounter for prophylactic measures, unspecified: Secondary | ICD-10-CM

## 2013-07-17 ENCOUNTER — Ambulatory Visit: Payer: BC Managed Care – PPO | Admitting: *Deleted

## 2013-12-27 ENCOUNTER — Other Ambulatory Visit: Payer: Self-pay | Admitting: Family Medicine

## 2013-12-29 ENCOUNTER — Other Ambulatory Visit: Payer: Self-pay | Admitting: Obstetrics and Gynecology

## 2013-12-29 DIAGNOSIS — N949 Unspecified condition associated with female genital organs and menstrual cycle: Secondary | ICD-10-CM

## 2013-12-31 ENCOUNTER — Ambulatory Visit
Admission: RE | Admit: 2013-12-31 | Discharge: 2013-12-31 | Disposition: A | Discharge status: Home / Self Care | Payer: BC Managed Care – PPO | Source: Ambulatory Visit | Attending: Obstetrics and Gynecology | Admitting: Obstetrics and Gynecology

## 2013-12-31 ENCOUNTER — Other Ambulatory Visit: Payer: BC Managed Care – PPO

## 2013-12-31 DIAGNOSIS — N949 Unspecified condition associated with female genital organs and menstrual cycle: Secondary | ICD-10-CM

## 2013-12-31 MED ORDER — GADOBENATE DIMEGLUMINE 529 MG/ML IV SOLN
20.0000 mL | Freq: Once | INTRAVENOUS | Status: AC | PRN
  Administered 2013-12-31: 20 mL via INTRAVENOUS

## 2014-02-18 ENCOUNTER — Encounter (HOSPITAL_COMMUNITY): Admission: RE | Payer: Self-pay | Source: Ambulatory Visit

## 2014-02-18 ENCOUNTER — Ambulatory Visit (HOSPITAL_COMMUNITY)
Admission: RE | Admit: 2014-02-18 | Payer: BC Managed Care – PPO | Source: Ambulatory Visit | Admitting: Obstetrics and Gynecology

## 2014-02-18 SURGERY — LAPAROSCOPY, DIAGNOSTIC
Anesthesia: General

## 2014-03-15 ENCOUNTER — Other Ambulatory Visit: Payer: Self-pay | Admitting: Family Medicine

## 2014-03-16 ENCOUNTER — Other Ambulatory Visit: Payer: Self-pay | Admitting: Family Medicine

## 2014-03-16 ENCOUNTER — Telehealth: Payer: Self-pay | Admitting: Family Medicine

## 2014-03-16 MED ORDER — LISINOPRIL-HYDROCHLOROTHIAZIDE 20-12.5 MG PO TABS: ORAL_TABLET | ORAL | Status: DC

## 2014-03-16 NOTE — Telephone Encounter (Signed)
Pt has made appt for 10/6 but does not have enough lisinopril-hydrochlorothiazide (PRINZIDE,ZESTORETIC) 20-12.5 MG per tablet  med to get her through. Can you send 30 days to  [facility] [place]

## 2014-03-30 ENCOUNTER — Encounter: Payer: Self-pay | Admitting: Family Medicine

## 2014-03-30 ENCOUNTER — Ambulatory Visit (INDEPENDENT_AMBULATORY_CARE_PROVIDER_SITE_OTHER): Payer: BC Managed Care – PPO | Admitting: Family Medicine

## 2014-03-30 VITALS — BP 110/80 | Temp 98.1°F | Wt 259.0 lb

## 2014-03-30 DIAGNOSIS — I10 Essential (primary) hypertension: Secondary | ICD-10-CM

## 2014-03-30 DIAGNOSIS — Z23 Encounter for immunization: Secondary | ICD-10-CM

## 2014-03-30 LAB — CBC WITH DIFFERENTIAL/PLATELET
BASOS PCT: 0.3 % (ref 0.0–3.0)
Basophils Absolute: 0 10*3/uL (ref 0.0–0.1)
Eosinophils Absolute: 0.2 10*3/uL (ref 0.0–0.7)
Eosinophils Relative: 2 % (ref 0.0–5.0)
HCT: 41.1 % (ref 36.0–46.0)
HEMOGLOBIN: 13.9 g/dL (ref 12.0–15.0)
Lymphocytes Relative: 30.1 % (ref 12.0–46.0)
Lymphs Abs: 2.3 10*3/uL (ref 0.7–4.0)
MCHC: 33.9 g/dL (ref 30.0–36.0)
MCV: 88.6 fl (ref 78.0–100.0)
MONOS PCT: 6.1 % (ref 3.0–12.0)
Monocytes Absolute: 0.5 10*3/uL (ref 0.1–1.0)
NEUTROS ABS: 4.8 10*3/uL (ref 1.4–7.7)
Neutrophils Relative %: 61.5 % (ref 43.0–77.0)
Platelets: 273 10*3/uL (ref 150.0–400.0)
RBC: 4.64 Mil/uL (ref 3.87–5.11)
RDW: 13.4 % (ref 11.5–15.5)
WBC: 7.8 10*3/uL (ref 4.0–10.5)

## 2014-03-30 LAB — BASIC METABOLIC PANEL
BUN: 12 mg/dL (ref 6–23)
CO2: 27 meq/L (ref 19–32)
Calcium: 8.9 mg/dL (ref 8.4–10.5)
Chloride: 101 mEq/L (ref 96–112)
Creatinine, Ser: 0.9 mg/dL (ref 0.4–1.2)
GFR: 69.13 mL/min (ref >60.00)
Glucose, Bld: 129 mg/dL — ABNORMAL HIGH (ref 70–99)
POTASSIUM: 3.5 meq/L (ref 3.5–5.1)
SODIUM: 136 meq/L (ref 135–145)

## 2014-03-30 MED ORDER — LISINOPRIL-HYDROCHLOROTHIAZIDE 20-12.5 MG PO TABS: ORAL_TABLET | ORAL | Status: DC

## 2014-03-30 NOTE — Progress Notes (Signed)
   Subjective:    Patient ID: [name], female    DOB: 05/15/1971, 43 y.o.   MRN: [id number]  HPI [name] is a 43 year old female nonsmoker who comes in today for evaluation of hypertension  He is on Zestoretic 20/12.5 daily BP 110/80. She also takes metformin 500 twice a day from her GYN for PCO S. and endocrinology treat her for her hypo-thyroidism.  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

## 2014-03-30 NOTE — Progress Notes (Signed)
Pre visit review using our clinic review tool, if applicable. No additional management support is needed unless otherwise documented below in the visit note. 

## 2014-03-30 NOTE — Patient Instructions (Signed)
Continue your current blood pressure medication daily  Followup in 1 year sooner if any problems  We will set up a nutrition consult to help you address your weight

## 2014-03-31 ENCOUNTER — Telehealth: Payer: Self-pay | Admitting: Family Medicine

## 2014-03-31 NOTE — Telephone Encounter (Signed)
emmi emailed °

## 2014-04-07 ENCOUNTER — Encounter: Payer: Self-pay | Admitting: Internal Medicine

## 2014-05-31 ENCOUNTER — Encounter: Payer: Self-pay | Admitting: *Deleted

## 2014-05-31 ENCOUNTER — Encounter: Payer: BC Managed Care – PPO | Attending: Family Medicine | Admitting: *Deleted

## 2014-05-31 DIAGNOSIS — E282 Polycystic ovarian syndrome: Secondary | ICD-10-CM | POA: Diagnosis not present

## 2014-05-31 DIAGNOSIS — Z6836 Body mass index (BMI) 36.0-36.9, adult: Secondary | ICD-10-CM | POA: Diagnosis not present

## 2014-05-31 DIAGNOSIS — Z713 Dietary counseling and surveillance: Secondary | ICD-10-CM | POA: Diagnosis not present

## 2014-05-31 NOTE — Progress Notes (Signed)
Medical Nutrition Therapy:  Appt start time: 0900 end time:  1000.   Assessment:  Primary concerns today: [name] is here for nutrition counseling pertaining to PCOS.  She was diagnosed  Maybe 8-10 years.  She would like to decrease her metformin dosage through weight loss.   Works with Chelsea RummageAutumn Jones, PA-C with [facility] (every 6 months).  [name] is frustrated because she hasn't had her medication adjusted in years.  She has not received any formal education about PCOS.  She has done her own research and found out the symptoms: fertility challenges, central adiposity, hirsutism, some insulin resistance. 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  She also is married with 2 children and her schedule is crazy. She graduates in May, but she admits she wasn't making healthy choices before school anyway, but now it may be worse [name] does most of the grocery shopping, but family dictates what foods are served.  She shares cooking responsibility with husband, and when she's really busy they go out to eat: 3 times/week at most: pizza, [place], sometimes fast food.  She usually eats in front of the tv, sometimes in the car.  She thinks she is a medium-paced eater. describes sleep issues- struggles with insomnia  Preferred Learning Style:  Auditory  Visual  Hands on  Learning Readiness:   Ready  MEDICATIONS: see list.  Metformin 2000 mg/day   DIETARY INTAKE:  Usual eating pattern includes 2-3 meals and 1-2 snacks per day.  Everyday foods include proteins, starches, vegetables.  Avoided foods include none.    24-hr recall:  B ( AM): hit or miss.  When she's out of school she doesn't have food, just coffee.  Might have bowl cereal  Snk ( AM): granola bar during school  L ( PM): bring from home- sandwich or pasta, or whatever leftovers.   Snk ( PM): raw veggies sometimes D ( PM): meat, starch, vegetable; tacos Snk ( PM):  not ususally Beverages: water, coffee in morning, occasionally tea  Usual physical activity: none  Estimated energy needs: 1800 calories 200 g carbohydrates 135 g protein 50 g fat    Nutritional Diagnosis:  [place]-2.1 Inpaired nutrition utilization As related to carbohydrates.  As evidenced by PCOS.    Intervention:  Nutrition counseling provided.  Discussed physiology of carbohydrate metabolism and how it is affected by PCOS.  Discussed hormonal imbalances associated with PCOS and how those imbalances present themselves with hirsutism, body acne, menstrual irregularity, obesity, and poor glycemic control.  Dicussed possible increased risk for CVD and the importance of nutrition management for overall health.   Recommended the Mediterranean style eating plan: MUFAs, whole grains, fruits, vegetables, legumes, lean proteins, and low-fat dairy.  Recommended limiting refined carbohydrates and concentrated sweets.  Suggested regularly scheduled meals and snacks and to avoid meal skipping.  Recommended fiber and lean protein with all meals and to include non-starchy vegetables with most meals.    Recommended regular physical activity of 150 minutes/week.  Discussed reading food labels: focusing on fiber and limited sugars and saturated, trans fats.  Suggested talking with provider about inositol supplementation:  D-chiro-inositol reduces circulating insulin, decreases serum androgens, and ameliorates some of the metabolic abnormalities (increased blood pressure and hypertriglyceridemia) of syndrome X   Teaching Method Utilized:  Visual Auditory   Handouts given during visit include: Mediterranean lifestyle Low Glycemic index foods What is inositol  Barriers to learning/adherence to lifestyle change: family and busy  schedule  Demonstrated degree of understanding via:  Teach Back   Monitoring/Evaluation:  Dietary intake, exercise, and body weight in 6-8 week(s).

## 2014-06-11 ENCOUNTER — Encounter: Payer: Self-pay | Admitting: Internal Medicine

## 2014-06-11 ENCOUNTER — Ambulatory Visit (INDEPENDENT_AMBULATORY_CARE_PROVIDER_SITE_OTHER): Payer: BC Managed Care – PPO | Admitting: Internal Medicine

## 2014-06-11 VITALS — BP 144/80 | HR 104 | Ht 70.5 in | Wt 260.4 lb

## 2014-06-11 DIAGNOSIS — K602 Anal fissure, unspecified: Secondary | ICD-10-CM

## 2014-06-11 DIAGNOSIS — K648 Other hemorrhoids: Secondary | ICD-10-CM | POA: Insufficient documentation

## 2014-06-11 HISTORY — DX: Anal fissure, unspecified: K60.2

## 2014-06-11 MED ORDER — BENEFIBER PO POWD: ORAL | Status: DC

## 2014-06-11 MED ORDER — DILTIAZEM GEL 2 %: 1.0000 "application " | Freq: Three times a day (TID) | CUTANEOUS | Status: DC

## 2014-06-11 NOTE — Progress Notes (Signed)
   Subjective:    Patient ID: Barbarann Ehlers, female    DOB: 11/08/1970, 43 y.o.   MRN: [id number]  HPI  Patient presents for evaluation of rectal bleeding and some rectal pain. Last year after a cruise started to have more constipation. Also had some heartburn and indigestion which was self-limited over a few months.Has been having sharp pain with defecation "like glass" and also bright red blood on the tissue only frequently. Can feel swelling at anal area and has an old anal tag. Dr. Gaetano Net recommended probiotic which has helped but still as the problems. Some straining but no prolonged stooling or toilet time. She's never had any type of endoscopic workup. While she has some hard ball-like stools at times and large stools she does not have any progressive decline in stool caliber or anything like that intends to move bowels on most days. In the past she said she is to have more loose stools. 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  Medications, allergies, past medical history, past surgical history, family history and social history are reviewed and updated in the EMR.  Review of Systems As above, working on weight loss with dietitian. Denies any recent infections lung or cardiac problems.    Objective:   Physical Exam General:  Obese, well-developed, well-nourished and in no acute distress Eyes:  anicteric. Lungs: Clear to auscultation bilaterally. Heart:  S1S2, no rubs, murmurs, gallops. Abdomen:  soft, non-tender, no hepatosplenomegaly, hernia, or mass and BS+.  Rectal:  Patti [place], [place] present.  Anoderm shows anterior scar from prior birth trauma tear There is a firm ridge posterior which is slightly render Brown stool, no mass   Anoscopy was performed with the patient in the left lateral decubitus position while a chaperone was present and revealed a posteror anal fissure with Grade 1 RP and LL internal  hemorrhoids  Neuro:  A&O x 3.  Psych:  appropriate mood and  Affect.   Data Reviewed: PCP note in the computer from October 2015 CBC was normal then though was be met except hyperglycemia nonfasting    Assessment & Plan:  Anal fissure-posterior I think this is her primary problem. She has a posterior fissure that I will treat with diltiazem gel twice a day to 3 times a day. She will return in 2 months for reassessment and depending upon what is going on May need further treatment, may need banding of hemorrhoids. She will also add Benefiber 2 tablespoons daily.  Internal hemorrhoids Anoscopy has shown right posterior and left lateral internal hemorrhoids, there is a small external component as well though that is minor, I'm not sure these are that symptomatic right now. We will treat the fissure and follow-up and determine if there is any need for banding.    I reviewed with her the importance to follow-up and make sure this resolves. A colonoscopy could be needed but it's really sounds like some constipation with anorectal problems.  I appreciate the opportunity to care for this patient.  I will send a copy to Radene Journey M.D.

## 2014-06-11 NOTE — Assessment & Plan Note (Signed)
Anoscopy has shown right posterior and left lateral internal hemorrhoids, there is a small external component as well though that is minor, I'm not sure these are that symptomatic right now. We will treat the fissure and follow-up and determine if there is any need for banding.

## 2014-06-11 NOTE — Assessment & Plan Note (Signed)
I think this is her primary problem. She has a posterior fissure that I will treat with diltiazem gel twice a day to 3 times a day. She will return in 2 months for reassessment and depending upon what is going on May need further treatment, may need banding of hemorrhoids. She will also add Benefiber 2 tablespoons daily.

## 2014-06-11 NOTE — Patient Instructions (Addendum)
Today you have been given a handout to read on Anal Fissure and Benefiber.   Follow up with us in February, appointment made for the 9th at 8:30am.   We have sent the following medications to your pharmacy for you to pick up at your convenience: Diltiazem gel and Benefiber, [facility]   I appreciate the opportunity to care for you.

## 2014-07-17 ENCOUNTER — Other Ambulatory Visit: Payer: Self-pay | Admitting: Family Medicine

## 2014-08-02 ENCOUNTER — Ambulatory Visit: Payer: BC Managed Care – PPO | Admitting: *Deleted

## 2014-08-03 ENCOUNTER — Ambulatory Visit (INDEPENDENT_AMBULATORY_CARE_PROVIDER_SITE_OTHER): Payer: BLUE CROSS/BLUE SHIELD | Admitting: Internal Medicine

## 2014-08-03 ENCOUNTER — Encounter: Payer: Self-pay | Admitting: Internal Medicine

## 2014-08-03 VITALS — BP 100/64 | HR 72 | Ht 70.0 in | Wt 261.2 lb

## 2014-08-03 DIAGNOSIS — K602 Anal fissure, unspecified: Secondary | ICD-10-CM

## 2014-08-03 DIAGNOSIS — K648 Other hemorrhoids: Secondary | ICD-10-CM

## 2014-08-03 MED ORDER — DILTIAZEM GEL 2 %: 1.0000 "application " | Freq: Three times a day (TID) | CUTANEOUS | Status: DC

## 2014-08-03 NOTE — Assessment & Plan Note (Signed)
Improving Continue diltiazem Increase Benefiber to 2 tbsp/day RTC 2-3 months

## 2014-08-03 NOTE — Progress Notes (Signed)
   Subjective:    Patient ID: [name], female    DOB: 07/23/1970, 44 y.o.   MRN: [id number] Chief complaint: rectal bleeding and rectal pain with anal fissure HPI Patient is here for follow-up of anal fissure and hemorrhoids. She is on diltiazem gel and Benefiber. She reports progressive improvement in her symptoms with less pain and less bleeding. Her stools are not always soft and easy to pass. She misunderstood and is using a teaspoon of Benefiber daily. Medications, allergies, past medical history, past surgical history, family history and social history are reviewed and updated in the EMR.  Review of Systems As per history of present illness    Objective:   Physical Exam BP 100/64 mmHg  Pulse 72  Ht 5\' 10"  (1.778 m)  Wt 261 lb 4 oz (118.502 kg)  BMI 37.49 kg/m2 Obese pleasant no acute distress    Assessment & Plan:  Anal fissure-posterior Improving Continue diltiazem Increase Benefiber to 2 tbsp/day RTC 2-3 months   Internal hemorrhoids Still come bleeding- fissure vs these or both Continue conservative RX for now    Cc: Harold HedgeJames Tomblin MD

## 2014-08-03 NOTE — Assessment & Plan Note (Signed)
Still come bleeding- fissure vs these or both Continue conservative RX for now

## 2014-08-03 NOTE — Patient Instructions (Signed)
Glad your improved.  Continue your Diltiazem and we have refilled this for you.  Take your benefiber 2 tablespoons daily.  Follow up with Dr. Leone PayorGessner in 2-3 months.   I appreciate the opportunity to care for you.

## 2014-09-19 ENCOUNTER — Other Ambulatory Visit: Payer: Self-pay | Admitting: Family Medicine

## 2015-01-28 ENCOUNTER — Other Ambulatory Visit: Payer: Self-pay | Admitting: Obstetrics and Gynecology

## 2015-01-31 LAB — CYTOLOGY - PAP

## 2015-03-22 ENCOUNTER — Encounter: Payer: Self-pay | Admitting: Family Medicine

## 2015-03-22 ENCOUNTER — Ambulatory Visit (INDEPENDENT_AMBULATORY_CARE_PROVIDER_SITE_OTHER): Payer: BLUE CROSS/BLUE SHIELD | Admitting: Family Medicine

## 2015-03-22 VITALS — BP 120/90 | HR 94 | Temp 98.2°F | Ht 70.0 in | Wt 259.9 lb

## 2015-03-22 DIAGNOSIS — E039 Hypothyroidism, unspecified: Secondary | ICD-10-CM | POA: Diagnosis not present

## 2015-03-22 DIAGNOSIS — I10 Essential (primary) hypertension: Secondary | ICD-10-CM

## 2015-03-22 DIAGNOSIS — E282 Polycystic ovarian syndrome: Secondary | ICD-10-CM | POA: Diagnosis not present

## 2015-03-22 DIAGNOSIS — J029 Acute pharyngitis, unspecified: Secondary | ICD-10-CM | POA: Diagnosis not present

## 2015-03-22 MED ORDER — LISINOPRIL-HYDROCHLOROTHIAZIDE 20-12.5 MG PO TABS: ORAL_TABLET | ORAL | Status: DC

## 2015-03-22 MED ORDER — LEVOTHYROXINE SODIUM 100 MCG PO TABS: 100.0000 ug | ORAL_TABLET | Freq: Every day | ORAL | Status: AC

## 2015-03-22 MED ORDER — ZOLPIDEM TARTRATE 5 MG PO TABS: 5.0000 mg | ORAL_TABLET | Freq: Every evening | ORAL | Status: AC | PRN

## 2015-03-22 MED ORDER — HYDROCODONE-HOMATROPINE 5-1.5 MG/5ML PO SYRP: ORAL_SOLUTION | ORAL | Status: DC

## 2015-03-22 MED ORDER — METFORMIN HCL 1000 MG PO TABS: ORAL_TABLET | ORAL | Status: DC

## 2015-03-22 NOTE — Progress Notes (Signed)
   Subjective:    Patient ID: [name], female    DOB: 11-03-1970, 44 y.o.   MRN: [id number]  HPI [name] is a 44 year old married female nonsmoker G-tube P2....... 1 year old son 57 year old daughter..... 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 Who comes in today for a viral infection and to get her medication renewed  She's had a cold for week manifested by head congestion cough. No fever no sputum production. No history of any asthma.  She takes Synthroid once daily 100 g. She's been on the same dose for many years but sees endocrinologist twice a year. Advised he could just do once a year since she stable  She's also taking 1000 mg of metformin twice a day for PCO S. Advised we could write her medication here  She takes Zestoretic 20-12 0.5 BP at home 110/80. BP here today was 120/90 area blood pressure elevated probably cut she's taken over-the-counter decongestants for cold. Advised to stop that.  She takes 5 mg of Ambien daily at bedtime when necessary for sleep. Over the summer she took 4 pills so it's very rare.   Review of Systems Review of systems otherwise negative except she's due for complete physical evaluation    Objective:   Physical Exam  Well-developed well-nourished female no acute distress vital signs stable she's afebrile BP 120/80  HEENT were negative neck was supple no adenopathy lungs are clear      Assessment & Plan:  Viral syndrome treat symptomatically........ lots of liquids...Marland KitchenMarland KitchenMarland Kitchen Tylenol.....Marland Kitchen no decongestants.... Hydromet 1/2-1 teaspoon at bedtime when necessary  PCO S.........Marland Kitchen metformin 1000 mg twice a day  Hypothyroidism........ continue Synthroid 100 g daily  Hypertension..... Continue Zestoretic,,,,,,, return CPX this fall

## 2015-03-22 NOTE — Progress Notes (Signed)
Pre visit review using our clinic review tool, if applicable. No additional management support is needed unless otherwise documented below in the visit note. 

## 2015-03-22 NOTE — Patient Instructions (Signed)
Drink lots of liquids........ Tylenol..... When necessary.......... no over-the-counter decongestants.......... Hydromet.... 1/2-1 teaspoon at bedtime when necessary for nighttime cough  Set up a physical exam sometime in the next couple months  Fasting labs one week prior  I refilled all your medications

## 2015-11-18 ENCOUNTER — Ambulatory Visit: Payer: BLUE CROSS/BLUE SHIELD | Admitting: Internal Medicine

## 2016-01-10 ENCOUNTER — Other Ambulatory Visit (INDEPENDENT_AMBULATORY_CARE_PROVIDER_SITE_OTHER): Payer: BLUE CROSS/BLUE SHIELD

## 2016-01-10 ENCOUNTER — Encounter: Payer: Self-pay | Admitting: Internal Medicine

## 2016-01-10 ENCOUNTER — Ambulatory Visit (INDEPENDENT_AMBULATORY_CARE_PROVIDER_SITE_OTHER): Payer: BLUE CROSS/BLUE SHIELD | Admitting: Internal Medicine

## 2016-01-10 VITALS — BP 116/88 | HR 84 | Ht 70.0 in | Wt 256.6 lb

## 2016-01-10 DIAGNOSIS — K589 Irritable bowel syndrome without diarrhea: Secondary | ICD-10-CM

## 2016-01-10 DIAGNOSIS — R194 Change in bowel habit: Secondary | ICD-10-CM

## 2016-01-10 DIAGNOSIS — L309 Dermatitis, unspecified: Secondary | ICD-10-CM

## 2016-01-10 LAB — TSH: TSH: 1.76 u[IU]/mL (ref 0.35–4.50)

## 2016-01-10 LAB — IGA: IgA: 346 mg/dL (ref 68–378)

## 2016-01-10 MED ORDER — NYSTATIN-TRIAMCINOLONE 100000-0.1 UNIT/GM-% EX OINT: 1.0000 "application " | TOPICAL_OINTMENT | Freq: Two times a day (BID) | CUTANEOUS | Status: AC

## 2016-01-10 NOTE — Patient Instructions (Addendum)
We have sent the following medications to your pharmacy for you to pick up at your convenience: Mycolog  Please follow up with Dr Leone PayorGessner on 03/19/16 @ 11:30 am  Please purchase benefiber over the counter and take 1 tablespoon every day.  Please take Align capsules, 1 capsule daily (over the counter)  Your physician has requested that you go to the basement for the following lab work before leaving today: CBC, ttg, iga, tsh  If you are age 47 or younger, your body mass index should be between 19-25. Your Body mass index is 36.82 kg/(m^2). If this is out of the aformentioned range listed, please consider follow up with your Primary Care Provider.   Thank you for choosing me and [facility].  Iva Booparl E. Gessner, MD, [facility] GrahamFACG

## 2016-01-10 NOTE — Progress Notes (Signed)
   Subjective:    Patient ID: [name], female    DOB: 11/02/1970, 45 y.o.   MRN: [id number] Cc: irregular bowel habits and bloating, rectal pain, bleeding HPI Last seen 2/20167 hx anal fissure and hemorrhoids also erratic defecation Overall better but sxs:  Alternating bowel habits and bloating Occasional ano-rectal pain Anal itching  Rare slight blood on TP Occasional lower abdominal cramps x minutes - beter w/ [facility] [facility] Dr. Henderson Cloudomblin suggested it was GI in orign Does not take fiber reg Stopped dilt gekl - anal irritation  Medications, allergies, past medical history, past surgical history, family history and social history are reviewed and updated in the EMR.  Review of Systems As above    Objective:   Physical Exam BP 116/88 mmHg  Pulse 84  Ht 5\' 10"  (1.778 m)  Wt 256 lb 9.6 oz (116.393 kg)  BMI 36.82 kg/m2 Obese NAD Ronny Baconottie Smith CMA present Abd: is soft and non-tender  Rectal: Mild perianal dermatitis and tags Nontender NL resting tone no mass and no fissure  Reviewed prior GI notes and labs     Assessment & Plan:   Encounter Diagnoses  Name Primary?  Marland Kitchen. Altered bowel habits Yes  . Perianal dermatitis   . IBS (irritable bowel syndrome)    Mycolog ointment for dermatitis Benefiber qd w/ Align to regulate bowel habits RTC 2 months CBC, TTG Ab abd IgA and TSH - all were NL If she fails to get better may need endoscopic evaluation

## 2016-01-11 ENCOUNTER — Encounter: Payer: Self-pay | Admitting: Internal Medicine

## 2016-01-11 LAB — CBC WITH DIFFERENTIAL/PLATELET
BASOS ABS: 0 10*3/uL (ref 0.0–0.1)
Basophils Relative: 0.4 % (ref 0.0–3.0)
Eosinophils Absolute: 0.1 10*3/uL (ref 0.0–0.7)
Eosinophils Relative: 1.7 % (ref 0.0–5.0)
HEMATOCRIT: 43.2 % (ref 36.0–46.0)
HEMOGLOBIN: 14.4 g/dL (ref 12.0–15.0)
LYMPHS PCT: 26.7 % (ref 12.0–46.0)
Lymphs Abs: 2.3 10*3/uL (ref 0.7–4.0)
MCHC: 33.5 g/dL (ref 30.0–36.0)
MCV: 87.6 fl (ref 78.0–100.0)
MONOS PCT: 4.6 % (ref 3.0–12.0)
Monocytes Absolute: 0.4 10*3/uL (ref 0.1–1.0)
NEUTROS ABS: 5.7 10*3/uL (ref 1.4–7.7)
Neutrophils Relative %: 66.6 % (ref 43.0–77.0)
Platelets: 275 10*3/uL (ref 150.0–400.0)
RBC: 4.93 Mil/uL (ref 3.87–5.11)
RDW: 13.9 % (ref 11.5–15.5)
WBC: 8.6 10*3/uL (ref 4.0–10.5)

## 2016-01-11 LAB — TISSUE TRANSGLUTAMINASE, IGA: Tissue Transglutaminase Ab, IgA: 1 U/mL (ref <4)

## 2016-01-11 NOTE — Progress Notes (Signed)
Quick Note:  Labs all ok no celiac disease Hope Benefiber and Align help See me in 2 mos ______

## 2016-03-19 ENCOUNTER — Ambulatory Visit: Payer: BLUE CROSS/BLUE SHIELD | Admitting: Internal Medicine

## 2016-03-22 ENCOUNTER — Other Ambulatory Visit: Payer: Self-pay | Admitting: Family Medicine

## 2016-03-23 NOTE — Telephone Encounter (Signed)
Spoke with pt about appointment being needed for further refills. Pt states that she is planning to transfer to a new PCP closer to her. I informed pt that I would fill prescription but no other refills will be given until an appointment is made. Pt verbalized understanding.

## 2016-05-25 ENCOUNTER — Encounter (INDEPENDENT_AMBULATORY_CARE_PROVIDER_SITE_OTHER): Payer: Self-pay

## 2016-05-25 ENCOUNTER — Ambulatory Visit (INDEPENDENT_AMBULATORY_CARE_PROVIDER_SITE_OTHER): Payer: BLUE CROSS/BLUE SHIELD | Admitting: Internal Medicine

## 2016-05-25 ENCOUNTER — Encounter: Payer: Self-pay | Admitting: Internal Medicine

## 2016-05-25 VITALS — BP 114/80 | HR 80 | Ht 70.0 in | Wt 263.0 lb

## 2016-05-25 DIAGNOSIS — K602 Anal fissure, unspecified: Secondary | ICD-10-CM

## 2016-05-25 DIAGNOSIS — K582 Mixed irritable bowel syndrome: Secondary | ICD-10-CM | POA: Diagnosis not present

## 2016-05-25 NOTE — Progress Notes (Signed)
   [name] 45 y.o. 04/01/1971 [id number]  Assessment & Plan:   1. Anal fissure   2. Irritable bowel syndrome with both constipation and diarrhea     She continues to be improved. She could not tolerate diltiazem gel due to anal irritation. She's not really symptomatic from her fissure at this time. She still has irritable bowel issues and will work on taking fiber supplementation regularly. There are no clear-cut dietary triggers. I will see her back as needed. Anticipate a screening colonoscopy after the age of 45. I appreciate the opportunity to care for this patient. CC: TODD,ELIZABETH, FNP   Subjective:   Chief Complaint: Follow-up anal fissure and IBS  HPI As seen in July, overall she is better. If she gets spells of diarrhea she'll have some anal irritation and symptomatic fissure but right now that's not a problem and she's not had any bleeding. She thinks if she takes fiber regularly her bowel habits are more regular and or less problems overall she is struggling to remember to make that behavioral change and take the fiber supplementation with Benefiber. In general she is satisfied with her quality of life at this time.  Medications, allergies, past medical history, past surgical history, family history and social history are reviewed and updated in the EMR.  Review of Systems As above  Objective:   Physical Exam BP 114/80 (BP Location: Left Arm, Patient Position: Sitting, Cuff Size: Normal)   Pulse 80   Ht 5\' 10"  (1.778 m)   Wt 263 lb (119.3 kg)   BMI 37.74 kg/m

## 2016-05-25 NOTE — Patient Instructions (Signed)
   Remember to take your benefiber.   We will put you in for a colonoscopy recall for 09/2018.     I appreciate the opportunity to care for you. Stan Headarl Gessner, MD, [facility]AC[facility]

## 2016-06-18 ENCOUNTER — Other Ambulatory Visit: Payer: Self-pay | Admitting: Family Medicine

## 2016-06-19 ENCOUNTER — Other Ambulatory Visit: Payer: Self-pay | Admitting: Emergency Medicine

## 2016-06-19 MED ORDER — LISINOPRIL-HYDROCHLOROTHIAZIDE 20-12.5 MG PO TABS: 1.0000 | ORAL_TABLET | Freq: Every day | ORAL | 0 refills | Status: AC

## 2016-11-22 ENCOUNTER — Other Ambulatory Visit: Payer: Self-pay | Admitting: Family Medicine

## 2016-11-22 NOTE — Telephone Encounter (Signed)
Denied.  Not seen since 2016.  Needs ov for bp evaluation.

## 2016-12-25 ENCOUNTER — Ambulatory Visit: Payer: BLUE CROSS/BLUE SHIELD | Admitting: Internal Medicine

## 2018-11-10 ENCOUNTER — Encounter: Payer: Self-pay | Admitting: Internal Medicine

## 2019-03-06 ENCOUNTER — Other Ambulatory Visit: Payer: Self-pay | Admitting: Surgery

## 2019-04-06 ENCOUNTER — Other Ambulatory Visit: Payer: Self-pay | Admitting: Surgery

## 2021-10-26 ENCOUNTER — Other Ambulatory Visit: Payer: Self-pay | Admitting: Obstetrics and Gynecology

## 2021-10-26 DIAGNOSIS — N644 Mastodynia: Secondary | ICD-10-CM

## 2021-11-13 ENCOUNTER — Ambulatory Visit
Admission: RE | Admit: 2021-11-13 | Discharge: 2021-11-13 | Disposition: A | Discharge status: Home / Self Care | Payer: BC Managed Care – PPO | Source: Ambulatory Visit | Attending: Obstetrics and Gynecology | Admitting: Obstetrics and Gynecology

## 2021-11-13 ENCOUNTER — Ambulatory Visit
Admission: RE | Admit: 2021-11-13 | Discharge: 2021-11-13 | Disposition: A | Discharge status: Home / Self Care | Payer: BLUE CROSS/BLUE SHIELD | Source: Ambulatory Visit | Attending: Obstetrics and Gynecology | Admitting: Obstetrics and Gynecology

## 2021-11-13 DIAGNOSIS — N644 Mastodynia: Secondary | ICD-10-CM

## 2022-11-14 ENCOUNTER — Other Ambulatory Visit: Payer: Self-pay | Admitting: Obstetrics and Gynecology

## 2022-11-14 DIAGNOSIS — N63 Unspecified lump in unspecified breast: Secondary | ICD-10-CM

## 2022-11-30 ENCOUNTER — Ambulatory Visit
Admission: RE | Admit: 2022-11-30 | Discharge: 2022-11-30 | Disposition: A | Discharge status: Home / Self Care | Payer: BC Managed Care – PPO | Source: Ambulatory Visit | Attending: Obstetrics and Gynecology | Admitting: Obstetrics and Gynecology

## 2022-11-30 DIAGNOSIS — N63 Unspecified lump in unspecified breast: Secondary | ICD-10-CM

## 2023-05-30 IMAGING — MG DIGITAL DIAGNOSTIC BILAT W/ TOMO W/ CAD
6 of 10 series · 6 of 30 positions shown · non-contrast
Comparison: Previous exam(s).

CLINICAL DATA: 51-year-old female complaining of a palpable
abnormality in the 3 o'clock region of the right breast 9 cm from
the nipple.

EXAM:
DIGITAL DIAGNOSTIC BILATERAL MAMMOGRAM WITH TOMOSYNTHESIS AND CAD;
ULTRASOUND RIGHT BREAST LIMITED
TECHNIQUE: Bilateral digital diagnostic mammography and breast tomosynthesis
was performed. The images were evaluated with computer-aided
detection.; Targeted ultrasound examination of the right breast was
performed

[R CC synth-2D (1 of 2)]
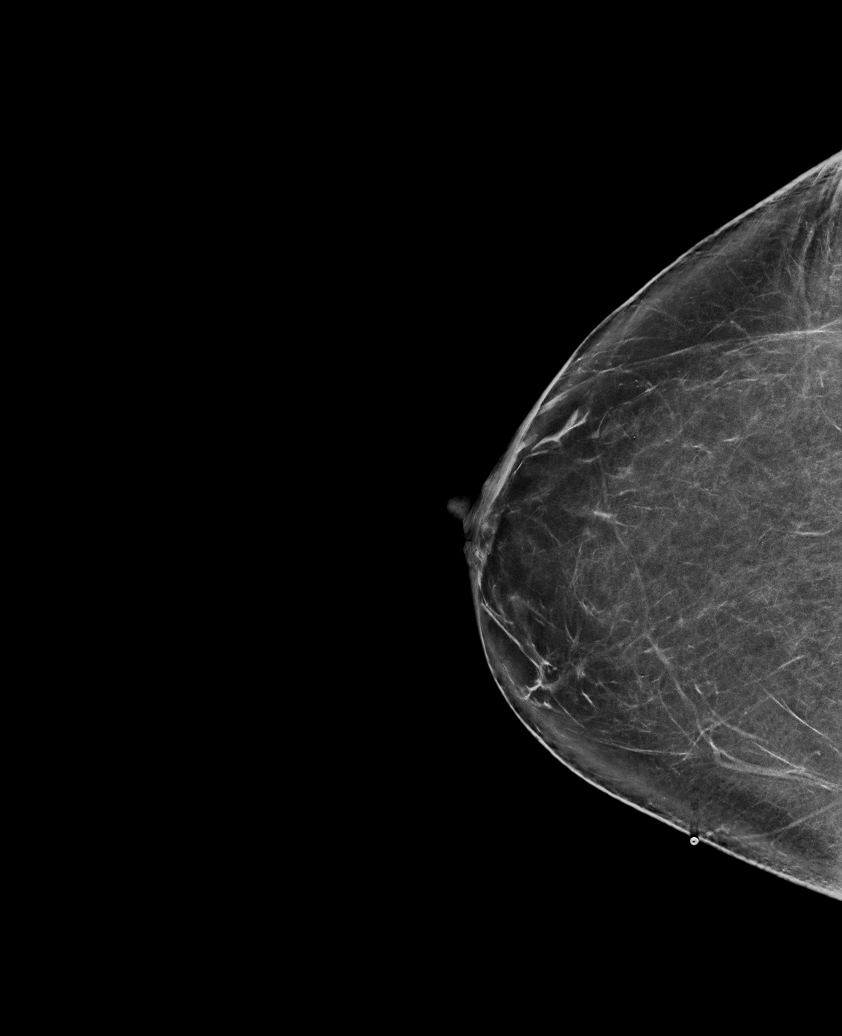

[L MLO synth-2D]
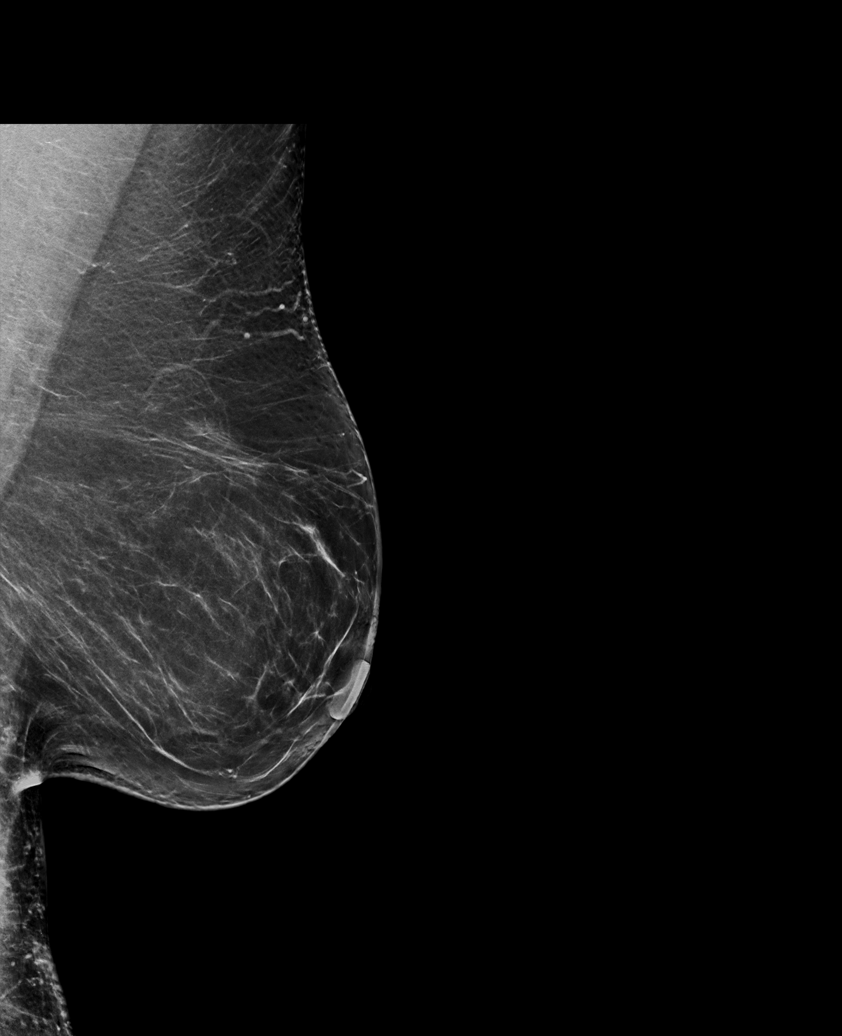

[L CC synth-2D]
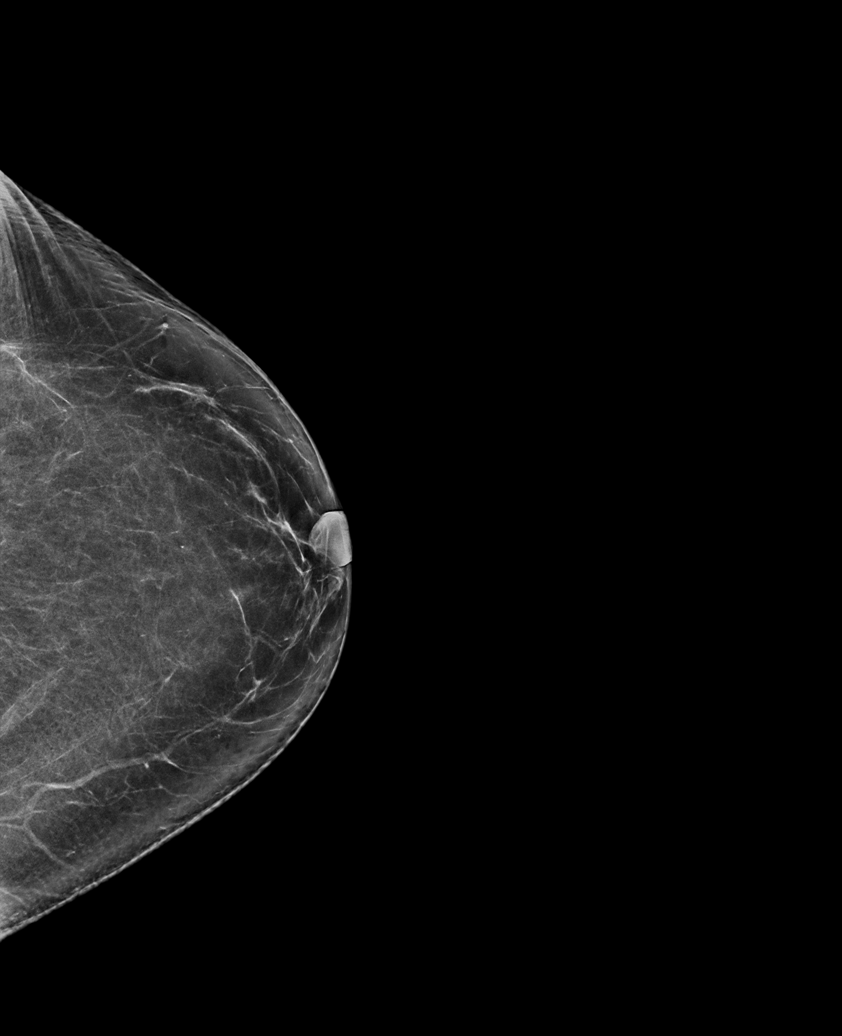

[R CC synth-2D (2 of 2)]
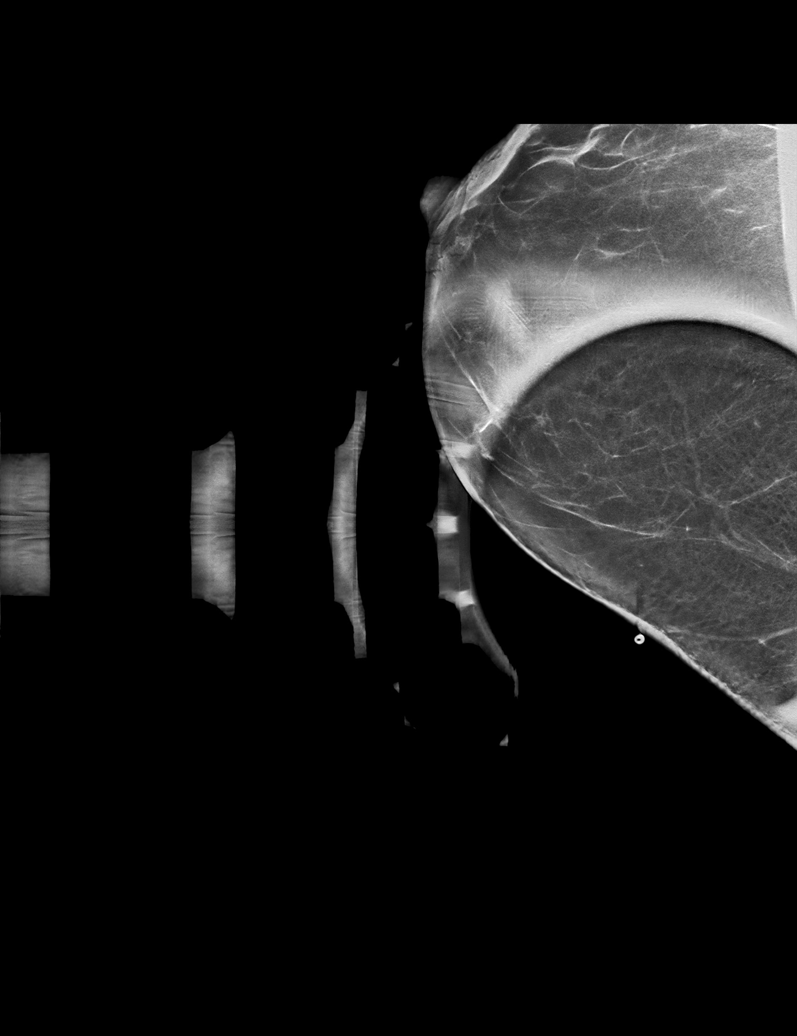

[R MLO synth-2D]
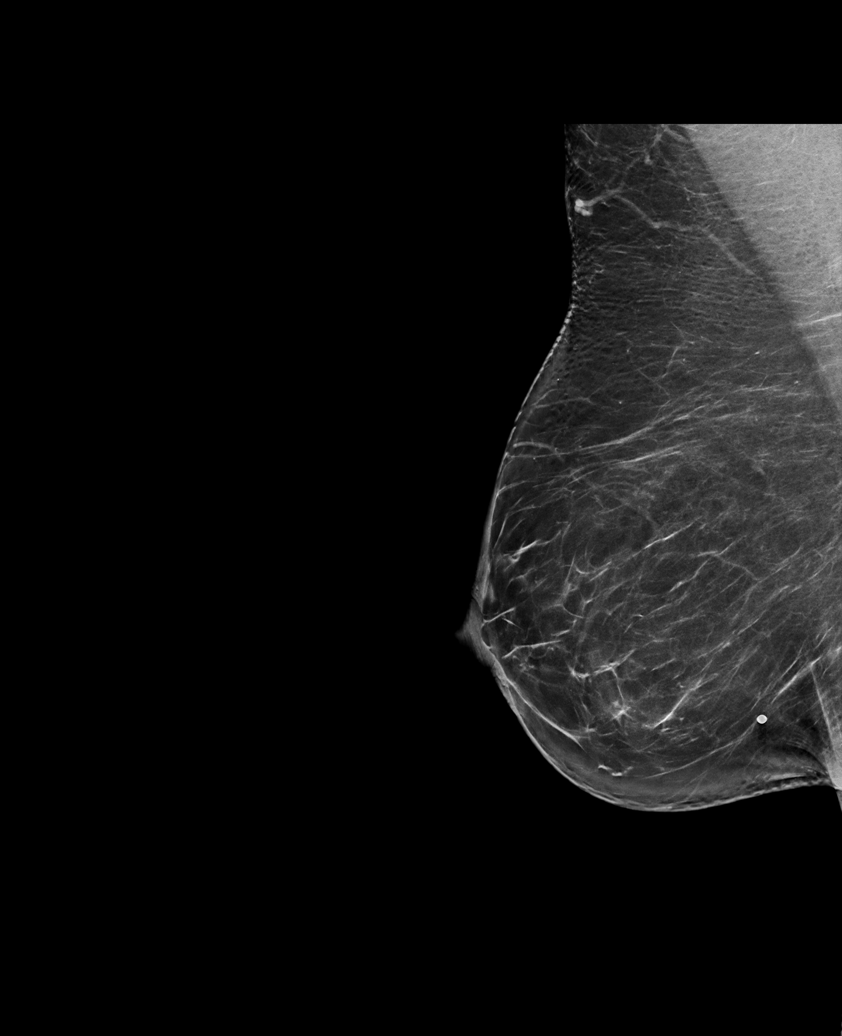

[R MLO tomo · tomo slice 40/79.0]
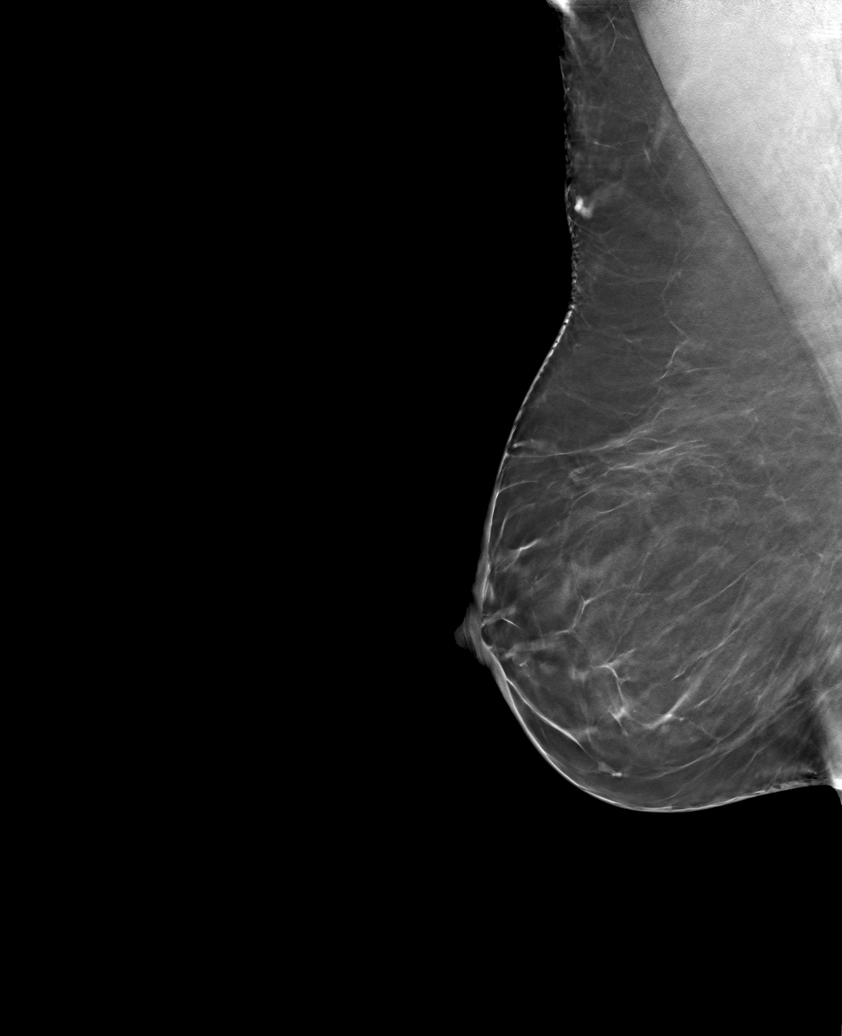

[6 of 30 positions shown; findings below may reference images not displayed]

ACR Breast Density Category b: There are scattered areas of
fibroglandular density.
FINDINGS: No suspicious mass, malignant type microcalcifications or distortion
detected in either breast. Spot tangential view of the area of
clinical concern in the right breast at 3 o'clock 9 cm from the
nipple shows normal fibrofatty tissue.

On physical exam, I palpate soft thickening in the right breast at 3
o'clock 9 cm from the nipple.

Targeted ultrasound is performed, showing normal tissue in the right
breast at 3 o'clock 9 cm from the nipple. No solid or cystic mass,
abnormal shadowing or distortion visualized.
IMPRESSION: No evidence of malignancy in either breast.

RECOMMENDATION:
Bilateral screening mammogram in 1 year is recommended.

I have discussed the findings and recommendations with the patient.
If applicable, a reminder letter will be sent to the patient
regarding the next appointment.

BI-RADS CATEGORY  1: Negative.
